# Patient Record
Sex: Female | Born: 1960 | Race: White | Hispanic: No | Marital: Married | Smoking: Never smoker
Health system: Southern US, Community
[De-identification: ages and names within clinical notes are randomized; demographics above are authoritative.]

---

## 1997-09-13 ENCOUNTER — Other Ambulatory Visit: Admission: RE | Admit: 1997-09-13 | Discharge: 1997-09-13 | Payer: Self-pay | Admitting: Obstetrics and Gynecology

## 1998-11-05 ENCOUNTER — Encounter: Payer: Self-pay | Admitting: *Deleted

## 1998-11-05 ENCOUNTER — Ambulatory Visit (HOSPITAL_COMMUNITY): Admission: RE | Admit: 1998-11-05 | Discharge: 1998-11-05 | Payer: Self-pay | Admitting: *Deleted

## 1998-11-17 ENCOUNTER — Other Ambulatory Visit: Admission: RE | Admit: 1998-11-17 | Discharge: 1998-11-17 | Payer: Self-pay | Admitting: Obstetrics and Gynecology

## 1999-03-31 ENCOUNTER — Other Ambulatory Visit: Admission: RE | Admit: 1999-03-31 | Discharge: 1999-03-31 | Payer: Self-pay | Admitting: *Deleted

## 1999-11-09 ENCOUNTER — Ambulatory Visit (HOSPITAL_COMMUNITY): Admission: RE | Admit: 1999-11-09 | Discharge: 1999-11-09 | Payer: Self-pay | Admitting: *Deleted

## 2001-03-07 ENCOUNTER — Ambulatory Visit (HOSPITAL_COMMUNITY): Admission: RE | Admit: 2001-03-07 | Discharge: 2001-03-07 | Payer: Self-pay | Admitting: *Deleted

## 2001-05-29 ENCOUNTER — Other Ambulatory Visit: Admission: RE | Admit: 2001-05-29 | Discharge: 2001-05-29 | Payer: Self-pay | Admitting: Obstetrics and Gynecology

## 2002-03-09 ENCOUNTER — Ambulatory Visit (HOSPITAL_COMMUNITY): Admission: RE | Admit: 2002-03-09 | Discharge: 2002-03-09 | Payer: Self-pay | Admitting: *Deleted

## 2003-03-25 ENCOUNTER — Ambulatory Visit (HOSPITAL_COMMUNITY): Admission: RE | Admit: 2003-03-25 | Discharge: 2003-03-25 | Payer: Self-pay | Admitting: *Deleted

## 2004-04-06 ENCOUNTER — Ambulatory Visit (HOSPITAL_COMMUNITY): Admission: RE | Admit: 2004-04-06 | Discharge: 2004-04-06 | Payer: Self-pay | Admitting: Obstetrics and Gynecology

## 2004-06-03 ENCOUNTER — Ambulatory Visit (HOSPITAL_COMMUNITY): Admission: RE | Admit: 2004-06-03 | Discharge: 2004-06-03 | Payer: Self-pay | Admitting: Obstetrics and Gynecology

## 2005-04-07 ENCOUNTER — Ambulatory Visit (HOSPITAL_COMMUNITY): Admission: RE | Admit: 2005-04-07 | Discharge: 2005-04-07 | Payer: Self-pay | Admitting: Obstetrics and Gynecology

## 2006-05-23 ENCOUNTER — Ambulatory Visit (HOSPITAL_COMMUNITY): Admission: RE | Admit: 2006-05-23 | Discharge: 2006-05-23 | Payer: Self-pay | Admitting: Obstetrics and Gynecology

## 2006-06-17 ENCOUNTER — Inpatient Hospital Stay (HOSPITAL_COMMUNITY): Admission: EM | Admit: 2006-06-17 | Discharge: 2006-06-26 | Payer: Self-pay | Admitting: Emergency Medicine

## 2006-12-23 ENCOUNTER — Ambulatory Visit: Payer: Self-pay | Admitting: Oncology

## 2007-01-26 LAB — CBC WITH DIFFERENTIAL/PLATELET
Basophils Absolute: 0 10*3/uL (ref 0.0–0.1)
EOS%: 0.7 % (ref 0.0–7.0)
Eosinophils Absolute: 0 10*3/uL (ref 0.0–0.5)
LYMPH%: 29.6 % (ref 14.0–48.0)
MCH: 32 pg (ref 26.0–34.0)
MCV: 89.8 fL (ref 81.0–101.0)
MONO#: 0.5 10*3/uL (ref 0.1–0.9)
MONO%: 7.3 % (ref 0.0–13.0)
Platelets: 286 10*3/uL (ref 145–400)
RDW: 13.5 % (ref 11.3–14.5)

## 2007-01-30 LAB — LACTATE DEHYDROGENASE: LDH: 156 U/L (ref 94–250)

## 2007-01-30 LAB — COMPREHENSIVE METABOLIC PANEL
AST: 18 U/L (ref 0–37)
Chloride: 105 mEq/L (ref 96–112)
Potassium: 4.5 mEq/L (ref 3.5–5.3)
Sodium: 140 mEq/L (ref 135–145)
Total Bilirubin: 0.2 mg/dL — ABNORMAL LOW (ref 0.3–1.2)
Total Protein: 7.4 g/dL (ref 6.0–8.3)

## 2007-01-30 LAB — PROTEIN C, TOTAL: Protein C, Total: 104 % (ref 70–140)

## 2007-01-30 LAB — LUPUS ANTICOAGULANT PANEL

## 2007-01-30 LAB — PROTEIN S, TOTAL: Protein S Ag, Total: 131 % (ref 70–140)

## 2007-02-15 ENCOUNTER — Ambulatory Visit: Payer: Self-pay | Admitting: Oncology

## 2007-02-20 ENCOUNTER — Ambulatory Visit (HOSPITAL_COMMUNITY): Admission: RE | Admit: 2007-02-20 | Discharge: 2007-02-20 | Payer: Self-pay | Admitting: Oncology

## 2007-06-20 ENCOUNTER — Ambulatory Visit (HOSPITAL_COMMUNITY): Admission: RE | Admit: 2007-06-20 | Discharge: 2007-06-20 | Payer: Self-pay | Admitting: Obstetrics and Gynecology

## 2008-07-25 ENCOUNTER — Ambulatory Visit (HOSPITAL_COMMUNITY): Admission: RE | Admit: 2008-07-25 | Discharge: 2008-07-25 | Payer: Self-pay | Admitting: Obstetrics and Gynecology

## 2009-02-09 IMAGING — CT CT ABD-PELV W/O CM
3 of 8 series · 12 of 42 positions shown, 18 images · IV contrast (CONTRAST)
Comparison: NONE

CLINICAL DATA: Left lower quadrant pain.  Diarrhea. 

CT ABDOMEN AND PELVIS WITHOUT AND WITH INTRAVENOUS AND FOLLOWING 
ORAL  CONTRAST
TECHNIQUE: Multiple axial 5-millimeter thick slices at 
5-millimeter intervals were obtained from the lung base through 
the pelvis following the intravenous administration of 97 cc of 
Optiray 350 at a rate of 3 cc per second.  Oral contrast was 
administered as well.  Arterial and venous phase imaging was 
obtained in the upper abdomen with delayed images obtained through 
the pelvis.

[Series 4: venous · axial · portal-venous · 0.86mm/px · z∈[+595,+930]mm · 5 of 101 slices shown, 10 images]
[im 17/101  soft-tissue]
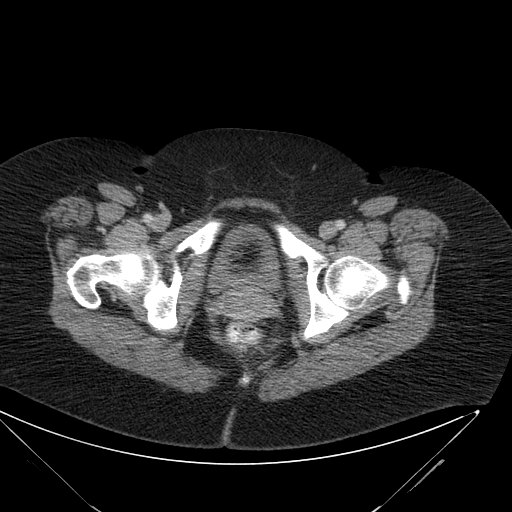
[im 17/101  bone]
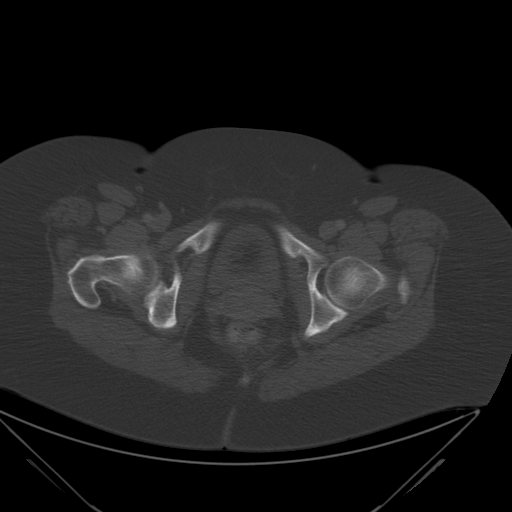
[im 34/101  soft-tissue]
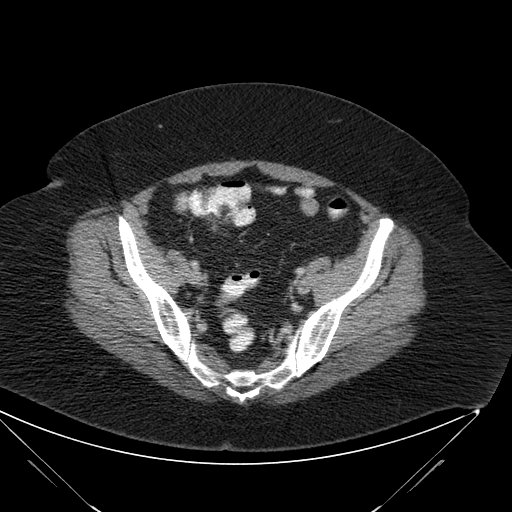
[im 34/101  lung]
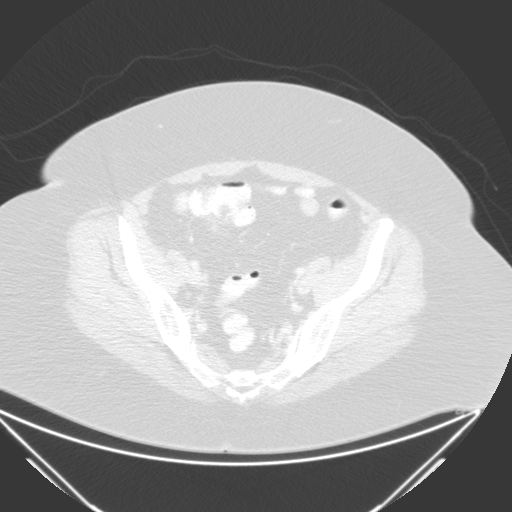
[im 51/101  soft-tissue]
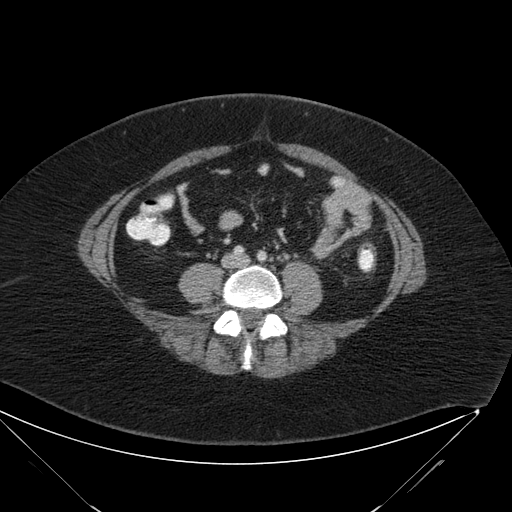
[im 51/101  lung]
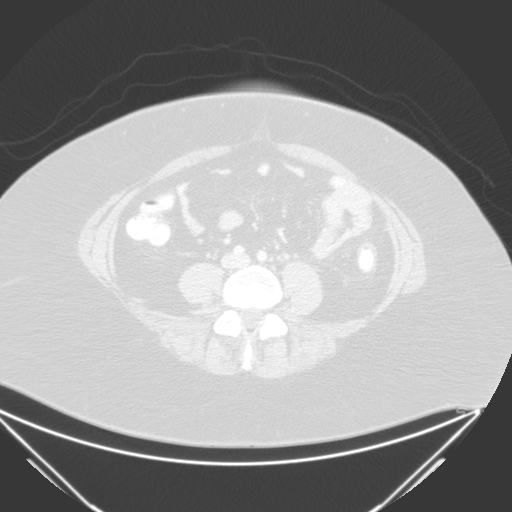
[im 67/101  soft-tissue]
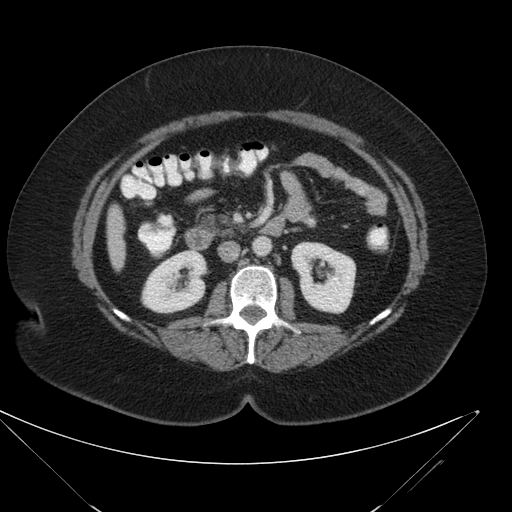
[im 67/101  lung]
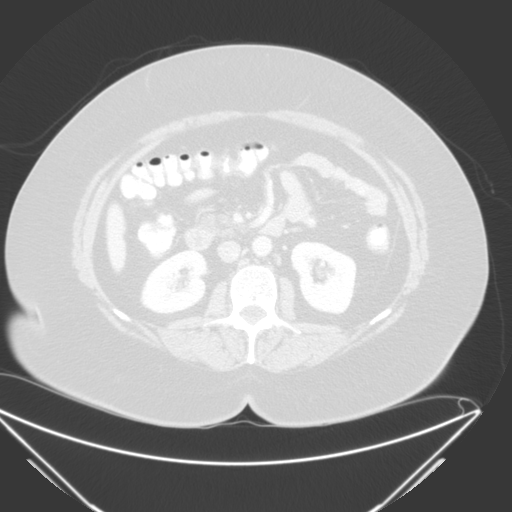
[im 84/101  soft-tissue]
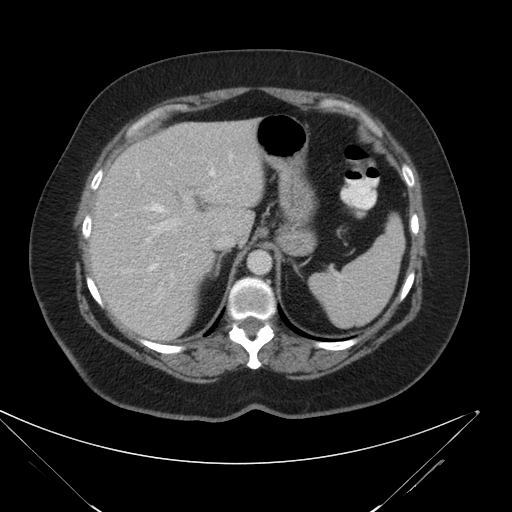
[im 84/101  lung]
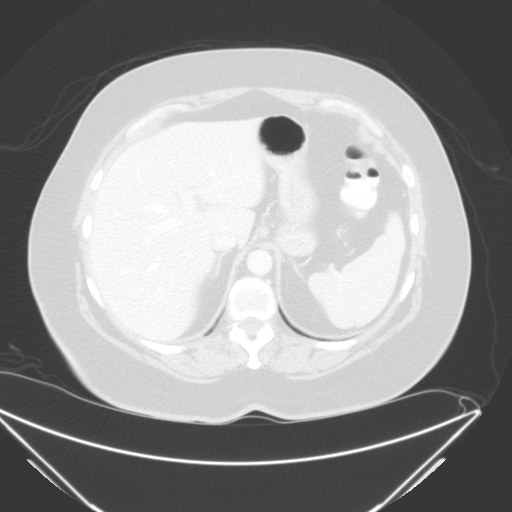

[Series 9: delays · axial · 0.87mm/px · z∈[+604,+889]mm · 4 of 95 slices shown]
[im 19/95  soft-tissue]
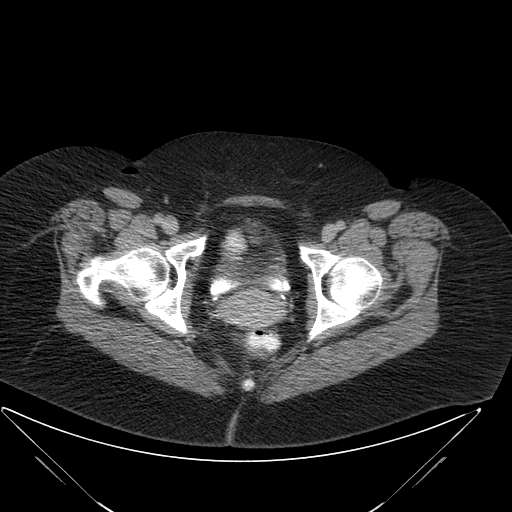
[im 38/95  soft-tissue]
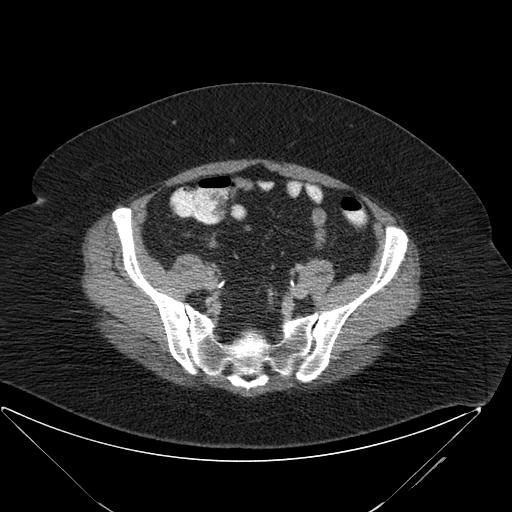
[im 57/95  soft-tissue]
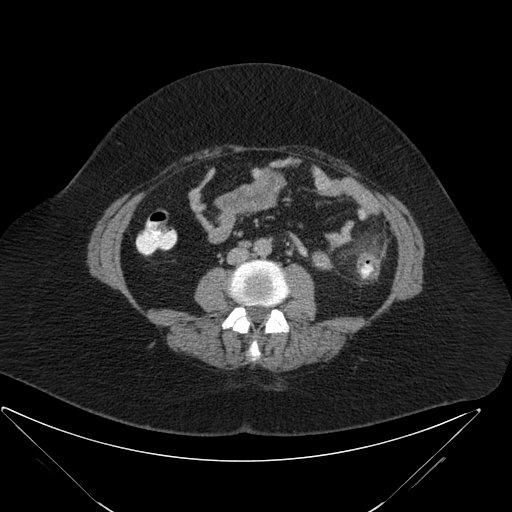
[im 76/95  soft-tissue]
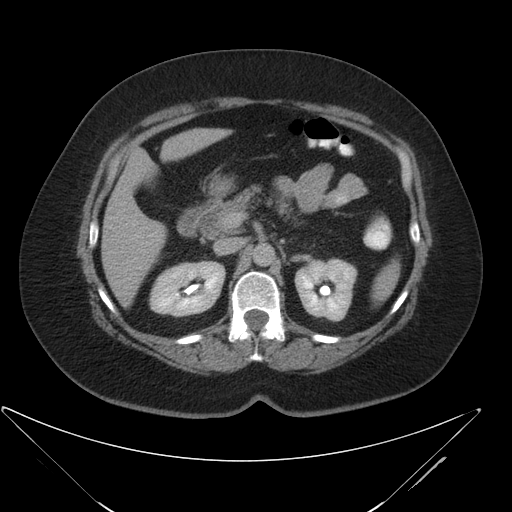

[coronals · coronal · 0.97mm/px · 3 of 70 slices shown, 4 images]
[im 24/70  soft-tissue]
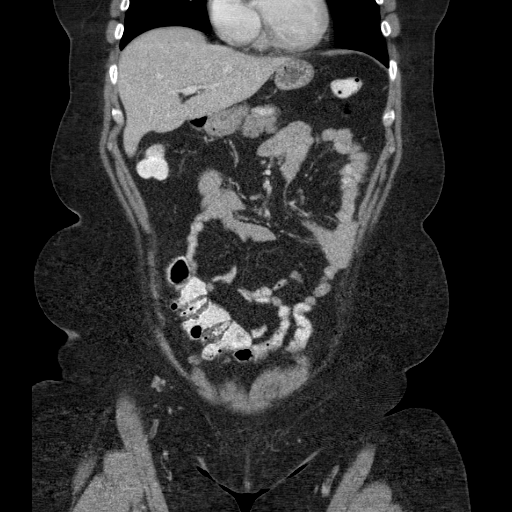
[im 31/70  soft-tissue]
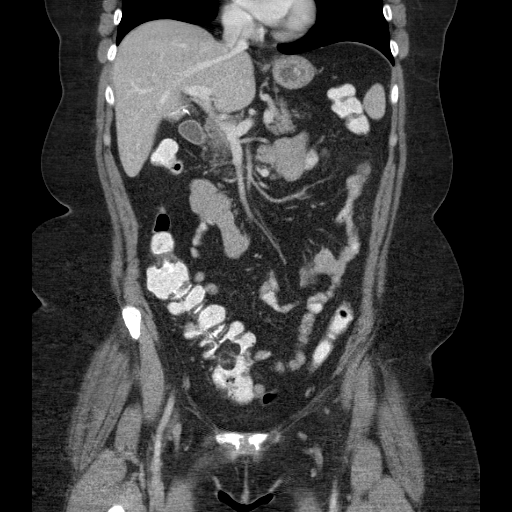
[im 31/70  bone]
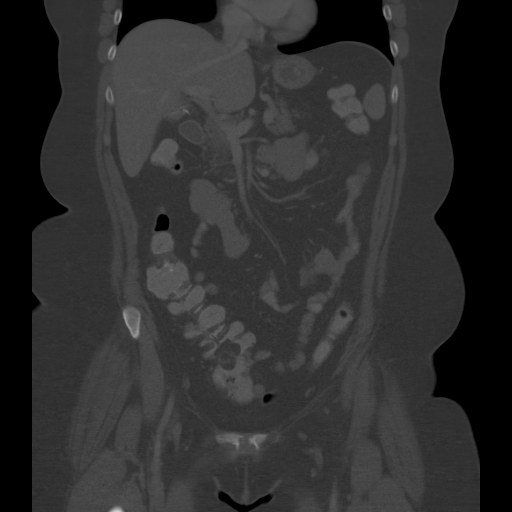
[im 39/70  soft-tissue]
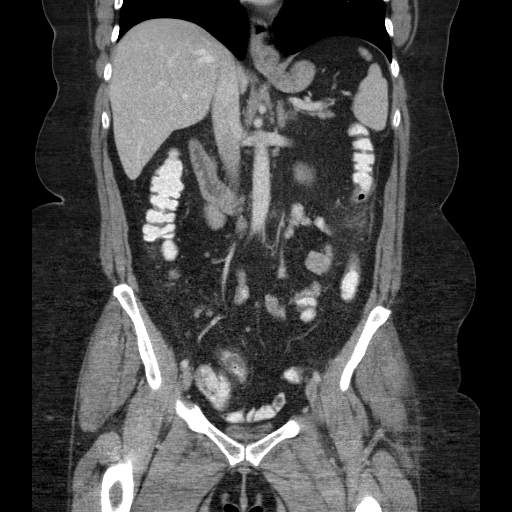

[12 of 42 positions shown; findings below may reference images not displayed]

FINDINGS: The heart size is within normal limits.  Visualized 
portions of the lung bases are within normal limits.  Patient is 
status post-cholecystectomy.  No evidence of hydronephrosis or 
nephrolithiasis.  Liver, spleen, adrenal glands, and pancreas are 
within normal limits.  Kidneys are within normal limits.  Uterus 
is grossly within normal limits.  There is a hypodense area seen 
in the right ovary.  This has a density of approximately 11 
Hounsfield units.  This measures approximately 4.8x1.7 cm.  Left 
ovary appears to be within normal limits.  The appendix is not 
visualized.  There are a few diverticula seen in the descending 
colon and evidence of stranding and increased density in the fat 
adjacent to the mid-descending colon.  No abscess collection. No 
free fluid or abdominal aortic aneurysm.  No evidence of 
lymphadenopathy.  No anterior abdominal wall hernia or inguinal 
hernia.  Visualized skeleton is within normal limits.
IMPRESSION: Current findings consistent with diverticulitis in 
the mid-descending colon. Hypodense lesion seen in the right 
ovary.  This likely represents an ovarian cyst.  I would recommend 
a pelvic ultrasound to exclude cystic ovarian neoplasm. No 
evidence of abscess or free air seen on the current study. Tiger 
12/23/2006  Trans Date: 12/23/2006 JH  JLM

## 2009-08-05 ENCOUNTER — Ambulatory Visit (HOSPITAL_COMMUNITY): Admission: RE | Admit: 2009-08-05 | Discharge: 2009-08-05 | Payer: Self-pay | Admitting: Obstetrics and Gynecology

## 2010-06-29 ENCOUNTER — Other Ambulatory Visit (HOSPITAL_COMMUNITY): Payer: Self-pay | Admitting: Obstetrics and Gynecology

## 2010-06-29 DIAGNOSIS — Z1231 Encounter for screening mammogram for malignant neoplasm of breast: Secondary | ICD-10-CM

## 2010-08-27 ENCOUNTER — Ambulatory Visit (HOSPITAL_COMMUNITY)
Admission: RE | Admit: 2010-08-27 | Discharge: 2010-08-27 | Disposition: A | Discharge status: Home / Self Care | Payer: Managed Care, Other (non HMO) | Source: Ambulatory Visit | Attending: Obstetrics and Gynecology | Admitting: Obstetrics and Gynecology

## 2010-08-27 DIAGNOSIS — Z1231 Encounter for screening mammogram for malignant neoplasm of breast: Secondary | ICD-10-CM

## 2010-09-25 NOTE — H&P (Signed)
[name], [name]              ACCOUNT NO.:  [id number]   MEDICAL RECORD NO.:  [id number]          PATIENT TYPE:  INP   LOCATION:  2923                         FACILITY:  [facility]   PHYSICIAN:  Isidor Holts, M.D.  DATE OF BIRTH:  July 10, 1960   DATE OF ADMISSION:  06/17/2006  DATE OF DISCHARGE:                              HISTORY & PHYSICAL   PRIMARY MEDICAL DOCTOR:  [facility].   CHIEF COMPLAINT:  Right-sided chest pain, hemoptysis and shortness of  breath of 1-day duration.   HISTORY OF PRESENT ILLNESS:  This is a 50 year old female.  For past  medical history, see below.  According to the patient, she has been  quite well until she went to work on June 16, 2006.  Sometime in the  afternoon, she developed right-sided/lower back pain which progressed  gradually, and subsequently became so bad that she could not really  sleep in bed all night, kept on changing positions and ended up sleeping  in the recliner.  She thought she had pulled something, although she  denies any trauma or heavy lifting.  This a.m., however, she got up and  got ready to go to work and thought throat was somewhat congested.  She coughed, and had hemoptysis about three times.  That alarmed her,  and she went to her primary MD who did a chest x-ray which apparently  was a technically poor film and nondiagnostic.  She was therefore  referred to [facility], where she had a chest CT angiogram  which confirmed a left pulmonary embolism.  She was referred to the  emergency department accordingly.   PAST MEDICAL HISTORY:  1. Seizure disorder.  2. Hypothyroidism.  3. Depression.  4. Irritable bowel syndrome.  5. Herpes genitalis, on chronic suppressive treatment.  6. Status post cholecystectomy.   MEDICATIONS:  1. Mysoline 250 mg p.o. t.i.d.  2. Zovirax 400 mg p.o. daily.  3. Synthroid 50 mcg p.o. daily.  4. Questran 1 g p.o. daily.  5. Zoloft 100 mg p.o. daily.  6. Calcium  with vitamin D 1200 mg p.o. daily.   ALLERGIES:  1. ERYTHROMYCIN  2. PHENYTOIN.  3. SULFA.  4. TEGRETOL.   SYSTEMS REVIEW:  As per HPI and chief complaint, otherwise negative.  The patient denies fever.  Denies abdominal pain, vomiting or diarrhea.  Denies leg pain or swelling.  She has had no recent travel, although she  does have a sedentary occupation.   SOCIAL HISTORY:  The patient is married, has 2 offspring who are alive  and well.  She is an ex-smoker, smoked a pack and half a day for 15  years but quit in 1991.  Denies alcohol use or drug abuse.   FAMILY HISTORY:  Both parents still living.  The patient's mother has  dyslipidemia and hypertension.  Her father is hypertensive.   FAMILY HISTORY:  Is otherwise noncontributory.  Certainly, there is no  history of venous thromboembolic disease in her family.   PHYSICAL EXAMINATION:  VITALS:  Temperature maximum 101.5, pulse 88 per  minute - regular, respiratory rate 20, BP 119/77 mmHg,  pulse oximeter  97% on 2 liters of oxygen.  The patient does not appear to be in obvious acute distress, although  she is unable to take deep breaths secondary to right-sided pleuritic-  type pain.  Alert, oriented, communicative, not short of breath at rest.  HEENT:  No clinical pallor.  No jaundice.  No conjunctival injection.  Has herpes labialis at the right angle of her upper lip.  Throat is  clear.  NECK:  Supple.  JVP not seen.  No palpable lymphadenopathy.  No palpable  goiter.  CHEST:  Clinically clear to auscultation.  No wheezes or crackles  although there is diminished air entry, right base.  HEART:  Sounds 1 and 2 heard, normal, regular; no murmurs.  ABDOMEN:  Is moderately obese, soft and nontender.  No palpable  organomegaly.  No palpable masses.  Normal bowel sounds.  LOWER EXTREMITY EXAMINATION:  No pitting edema.  Palpable peripheral  pulses.  No calf tenseness or tenderness noted.  MUSCULOSKELETAL SYSTEM:  Unremarkable.   CENTRAL NERVOUS SYSTEM:  No focal neurologic deficits on gross  examination.   INVESTIGATIONS:  CBC:  WBC 14.1, hemoglobin 13.1, hematocrit 38.0,  platelets 337.  Electrolytes:  Sodium 132, potassium 3.7, chloride 100,  CO2 22, BUN 6, creatinine 0.51, glucose 129.  AST 25, ALT 19.  INR 1.2.  Chest CT angiogram done at [facility] was reviewed by ED MD and  discussed with radiologist, and reportedly shows a right basal pneumonia  as well as left pulmonary embolism.   ASSESSMENT AND PLAN:  1. Right basilar pneumonia, community-acquired.  The patient was      already given Rocephin in the emergency department at the time of      this evaluation.  We will add Azithromycin.  Continue oxygen      supplementation and p.r.n. bronchodilator nebulizers.   1. Pulmonary embolism, left lung.  We shall commence anticoagulation      with intravenous heparin and start concomitant Coumadin.  However,      we shall send blood samples for thrombophilic screen, including      protein C, protein S, antithrombin III, prothrombin gene mutation,      homocysteine, Factor V Leiden, lupus anticoagulant, prior to      commencing anticoagulation as there is no obvious predisposing      factor.  For completeness, will do bilateral lower extremity venous      Doppler to rule out DVT.   1. Seizure disorder.  The patient has been seizure free for 22 years.      We shall continue pre-admission anticonvulsants.   1. History of hypothyroidism.  Continue Synthroid.  Check TSH and      lipids.   1. Irritable bowel syndrome.  Continue pre-admission Questran.   Further management will depend on clinical course.      Isidor Holts, M.D.  Electronically Signed     CO/MEDQ  D:  06/17/2006  T:  06/18/2006  Job:  [id number]

## 2010-09-25 NOTE — Discharge Summary (Signed)
[name], [name]              ACCOUNT NO.:  [id number]   MEDICAL RECORD NO.:  [id number]          PATIENT TYPE:  INP   LOCATION:  3743                         FACILITY:  [facility]   PHYSICIAN:  Madaline Savage, MD        DATE OF BIRTH:  12/25/1960   DATE OF ADMISSION:  06/17/2006  DATE OF DISCHARGE:  06/26/2006                               DISCHARGE SUMMARY   PRIMARY CARE PHYSICIAN:  [facility].   DISCHARGE DIAGNOSES:  1. Right lower lobe pneumonia with pleuritic chest pain.  2. Left-sided pulmonary embolus.  3. Hypothyroidism.  4. Irritable bowel syndrome.  5. Obesity.  6. History of seizure disorder in the past.   DISCHARGE MEDICATIONS:  1. Coumadin 50 mg daily, adjust according to INR.  2. Mysoline 250 mg 3 times daily.  3. Synthroid 50 mcg daily.  4. Questran 1 gram daily.  5. Zoloft 100 mg daily.  6. Calcium and Vitamin D.   HISTORY OF PRESENT ILLNESS:  For a full history of present illness see  history and physical dictated by Dr. Brien Few on June 17, 2006.  In short,  this is a 50 year old lady who comes in with right-sided chest pain,  hemoptysis, and shortness of breath.  Her initial investigations showed  right basilar pneumonia and also a CT scan showed a left-sided pulmonary  embolism.  She was admitted for further evaluation and management.  She  had the CT scan of her chest done at [facility], where the  pulmonary embolism was confirmed there.  It was not done in the  hospital.   PROBLEM LIST:  1. Left-sided pulmonary embolism.  She was started on Coumadin and the      Lovenox in the hospital.  To start to get her INR up, we had to      escalate the doses.  At the time of discharge, she was on 17.5 mg      of Coumadin daily.  I will be starting her on 15 mg of Coumadin a      day, hoping that that will keep her INR stable.  She will have her      INR checked in 2 days time at her primary care doctor's office and      her primary care  doctor will adjust the Coumadin according to her      INR.  2. Right-sided pneumonia with the pleuritic chest pain.  She had chest      pain on the right side where she had the pneumonia and her pain has      resolved after a couple of days.  She is now chest pain free and      she has been symptom free for the last couple of days.  She was      treated with antibiotics for 7 days in the hospital and she will      not be put on any antibiotics as an outpatient.  3. Hypothyroidism.  Continue on the same dose of Synthroid.   DISPOSITION:  She is now being discharged home in  a stable condition.   FOLLOWUP:  She will followup with her primary care doctor at [facility] in 2 days, she has an appointment.  She will also get  her PT and INR checked on June 28, 2006, at [facility], and the Coumadin dose will be adjusted accordingly.  She will  need to be on Coumadin for 3 to 6 months.  This is her first episode of  venous clot.      Madaline Savage, MD  Electronically Signed     PKN/MEDQ  D:  06/26/2006  T:  06/27/2006  Job:  [id number]

## 2010-09-25 NOTE — H&P (Signed)
[name], [name]              ACCOUNT NO.:  [id number]   MEDICAL RECORD NO.:  [id number]          PATIENT TYPE:  AMB   LOCATION:  SDC                           FACILITY:  [facility]   PHYSICIAN:  Richardean Sale, M.D.   DATE OF BIRTH:  Aug 24, 1960   DATE OF ADMISSION:  DATE OF DISCHARGE:                                HISTORY & PHYSICAL   PREOPERATIVE DIAGNOSIS:  Menorrhagia.   HISTORY OF PRESENT ILLNESS:  This is a 50 year old, gravida 3, para 2-0-1-2,  white female who has a history of significantly worsening menses that have  been refractory to treatment with progestin medication.  The patient is  unable to take oral contraceptive pills.  During her evaluation for  menorrhagia, the patient had an endometrial biopsy performed, which was  negative and a sonohysterogram which was negative for any endometrial polyp  or fibroids.  The patient's TSH was abnormal, consistent with hypothyroidism  and over the past few  months she has been treated with thyroid medication,  however, her menorrhagia has persisted.  We have discussed multiple  different options for treatment of menorrhagia, which include insertion of a  levonorgestrel IUD, continued attempt at effective control with progestins,  versus hysteroscopy with endometrial ablation.  The patient presents today  for hysteroscopy NovaSure endometrial ablation.   PAST MEDICAL HISTORY:  1.  Epilepsy.  2.  Hypothyroidism.  3.  Depression.  4.  Irritable bowel syndrome.  5.  HSV.   SURGICAL HISTORY:  Cholecystectomy.   SOCIAL HISTORY:  Former smoker.  No alcohol or drugs.   FAMILY HISTORY:  No known breast, uterine, ovarian, or colon cancer.  No  known bleeding disorder.   GYNECOLOGIC HISTORY:  No history of abnormal Pap smears.  Menses are  currently occurring every month, lasting up to seven days with flooding of  her clothes and interrupting her normal daily activities.   OBSTETRIC HISTORY:  Vaginal delivery x2.  First trimester  loss x1.   MEDICATIONS:  Zoloft, Zovirax, Naselin, Synthroid, cholestyramine,  multivitamin, calcium supplement.   DRUG ALLERGIES:  1.  ERYTHROMYCIN.  2.  SULFA.  3.  CODEINE.  4.  DILANTIN.  5.  TEGRETOL.   PHYSICAL EXAMINATION:  VITAL SIGNS:  Blood pressure is 118/70, weight 229,  height 5 foot 7 inches.  GENERAL:  She is a well-developed, well-nourished, white female in no  apparent distress.  NECK:  And thyroid are within normal limits without  masses.  HEART:  Regular rate and rhythm.  LUNGS:  Clear to auscultation bilaterally.  ABDOMEN:  Obese, soft, and nontender.  No palpable masses.  Liver spleen are  normal.  EXTERNAL GENITALIA:  Within normal limits.  PELVIC:  Uterus is small, midline, mobile, retroverted, nontender.  Adnexa  not palpable.  EXTREMITIES:  No cyanosis, clubbing, or edema.   ASSESSMENT:  A 50 year old, gravida 3, para 2-0-1-2, white female presents  for hysteroscopy with NovaSure endometrial ablation.   PLAN:  I have reviewed the risks, benefits, and alternatives of the  treatment with the patient.  Informed consent was obtained.  We discussed  the risk of  hemorrhage requiring transfusion, injury to the uterus (uterine  perforation) which would require additional surgery through the abdomen to  evaluate for any injury to the bowel or bladder or other abdominal organs,  infection, anesthetic-related complications, DVT, and even death.  I  discussed with the patient that the goal of therapy is to decrease her  menstrual flow and to not eliminate her cycle entirely.  The patient voices  an understanding of the above and agrees to proceed.  All questions have  been answered and informed consent obtained.      JW/MEDQ  D:  06/02/2004  T:  06/02/2004  Job:  [id number]

## 2010-09-25 NOTE — Op Note (Signed)
[name], Mooney              ACCOUNT NO.:  [id number]   MEDICAL RECORD NO.:  [id number]          PATIENT TYPE:  AMB   LOCATION:  SDC                           FACILITY:  [facility]   PHYSICIAN:  Richardean Sale, M.D.   DATE OF BIRTH:  20-Jan-1961   DATE OF PROCEDURE:  06/03/2004  DATE OF DISCHARGE:                                 OPERATIVE REPORT   PREOPERATIVE DIAGNOSES:  Menorrhagia.   POSTOPERATIVE DIAGNOSES:  Menorrhagia.   PROCEDURE:  Hysteroscopy with NovaSure endometrial ablation.   SURGEON:  Richardean Sale, M.D.   ANESTHESIA:  Conscious sedation with paracervical block.   COMPLICATIONS:  None.   ESTIMATED BLOOD LOSS:  Minimal.   FLUID DEFICIT FOR HYSTEROSCOPY:  40 mL.   FINDINGS:  Normal appearing uterine cavity, measurements for NovaSure  uterine sound length 9 cm, cervical length 4.5 cm, cavity length 4.5 cm.  Cavity width 4.3 cm, power 106 x 1 minute 50 seconds.   INDICATIONS FOR PROCEDURE:  This is a 50 year old, gravida 3, para 2-0-1-2  white female with menorrhagia who has had a negative endometrial biopsy and  a negative sonohysterogram with no structural cause identified for her  menorrhagia. The patient has been tried on hormonal medications to control  her bleeding with no success. Her bleeding has been interfering with her  daily activity and she presents today for NovaSure endometrial ablation.  Prior to the procedure, the risk of the procedure had been reviewed with the  patient and informed consent obtained. We discussed the risk of hemorrhage  requiring transfusion, infection which could require hospitalization and  antibiotics and could cause scarring, injury to the uterus, uterine  perforation (which would require additional surgery through the abdomen to  evaluate for any injury to the bowel or the bladder or other abdominal  organ, anesthetic complications, DVT and even death). The patient voiced an  understanding of all these risks and agreed to  proceed.  Informed consent  obtained before proceeding to the OR.   DESCRIPTION OF PROCEDURE:  The patient was taken to the operating room where  she was given intravenous sedation. She was then prepped and draped in the  usual sterile fashion with Betadine and a red rubber catheter was used to  drain the bladder.  Bimanual exam was performed, the uterus was small,  slightly retroverted, mobile with no masses, adnexa were not palpable. A  speculum was then placed into the vagina, the cervix was easily visualized  and 2 mL of 1% Nesacaine were injected at the 12 o'clock position. A single  tooth tenaculum was then used to grasp the anterior lip of the cervix and a  paracervical block was then administered using a total of 22 mL of 1%  Nesacaine.  From this point, the uterus sounded to 9 cm, the cervical length  was estimated using a Pratt dilator at 4.5 cm making the uterine cavity  length 4.5 cm.  The settings were inserted into the NovaSure machine. At  this point, hysteroscopy was performed which revealed a normal appearing  intrauterine cavity with no obvious structural defects, no polyps, no  fibroids identified.  At this point, the hysteroscope was removed,  additional dilation was then performed to insert the NovaSure device. The  patient was complaining of discomfort, she was then given some additional  intravenous sedation by the nurse anesthetist and her discomfort was  adequately relieved.  Once the NovaSure device was inserted and the  measurement set appropriately and the device was fit and seated (the CO2  test was then performed and the cavity assessment was intact).  The NovaSure  device was then turned on and the ablation was performed without difficulty.  Total time was 1 minute 50 seconds, power was 106.  Once the ablation was  completed, the NovaSure device was removed, the hysteroscope was then  reintroduced and post procedural pictures were taken and the cavity had  been  adequately ablated.  At this point, the hysteroscope was removed, the single  tooth tenaculum was removed, there was minimal bleeding coming from the  cervical tenaculum site.  The patient tolerated the procedure very well with  no complications.  All instruments were removed from the patient's vagina.  The patient was taken out of the dorsal lithotomy position and was  transferred to the recovery room awake and in satisfactory condition.  All  sponge, lap, needle and instrument counts were correct x2. There were no  complications.      JW/MEDQ  D:  06/03/2004  T:  06/03/2004  Job:  [id number]

## 2011-04-22 ENCOUNTER — Other Ambulatory Visit: Payer: Self-pay | Admitting: Orthopedic Surgery

## 2011-04-22 DIAGNOSIS — M545 Low back pain: Secondary | ICD-10-CM

## 2011-04-25 ENCOUNTER — Ambulatory Visit
Admission: RE | Admit: 2011-04-25 | Discharge: 2011-04-25 | Disposition: A | Discharge status: Home / Self Care | Payer: Managed Care, Other (non HMO) | Source: Ambulatory Visit | Attending: Orthopedic Surgery | Admitting: Orthopedic Surgery

## 2011-04-25 DIAGNOSIS — M545 Low back pain: Secondary | ICD-10-CM

## 2011-07-20 ENCOUNTER — Other Ambulatory Visit (HOSPITAL_COMMUNITY): Payer: Self-pay | Admitting: Family Medicine

## 2011-07-20 DIAGNOSIS — Z1231 Encounter for screening mammogram for malignant neoplasm of breast: Secondary | ICD-10-CM

## 2011-08-19 ENCOUNTER — Telehealth: Payer: Self-pay | Admitting: *Deleted

## 2011-08-19 NOTE — Telephone Encounter (Signed)
Rec'd referral to for patient in regards to changing Coumadin to Pradaxa. Patient was previously seen by Dr Arline Asp and he has called and spoke to Dr Benedetto Goad directly about this referral. No appt to be made at this time.

## 2011-09-02 ENCOUNTER — Ambulatory Visit (HOSPITAL_COMMUNITY)
Admission: RE | Admit: 2011-09-02 | Discharge: 2011-09-02 | Disposition: A | Discharge status: Home / Self Care | Payer: Managed Care, Other (non HMO) | Source: Ambulatory Visit | Attending: Family Medicine | Admitting: Family Medicine

## 2011-09-02 DIAGNOSIS — Z1231 Encounter for screening mammogram for malignant neoplasm of breast: Secondary | ICD-10-CM | POA: Insufficient documentation

## 2012-08-02 ENCOUNTER — Other Ambulatory Visit (HOSPITAL_COMMUNITY): Payer: Self-pay | Admitting: Obstetrics and Gynecology

## 2012-08-02 DIAGNOSIS — Z1231 Encounter for screening mammogram for malignant neoplasm of breast: Secondary | ICD-10-CM

## 2012-09-05 ENCOUNTER — Ambulatory Visit (HOSPITAL_COMMUNITY)
Admission: RE | Admit: 2012-09-05 | Discharge: 2012-09-05 | Disposition: A | Discharge status: Home / Self Care | Payer: Managed Care, Other (non HMO) | Source: Ambulatory Visit | Attending: Obstetrics and Gynecology | Admitting: Obstetrics and Gynecology

## 2012-09-05 DIAGNOSIS — Z1231 Encounter for screening mammogram for malignant neoplasm of breast: Secondary | ICD-10-CM

## 2013-07-30 ENCOUNTER — Other Ambulatory Visit (HOSPITAL_COMMUNITY): Payer: Self-pay | Admitting: Obstetrics and Gynecology

## 2013-07-30 DIAGNOSIS — Z1231 Encounter for screening mammogram for malignant neoplasm of breast: Secondary | ICD-10-CM

## 2013-09-13 ENCOUNTER — Ambulatory Visit (HOSPITAL_COMMUNITY)
Admission: RE | Admit: 2013-09-13 | Discharge: 2013-09-13 | Disposition: A | Discharge status: Home / Self Care | Payer: Managed Care, Other (non HMO) | Source: Ambulatory Visit | Attending: Obstetrics and Gynecology | Admitting: Obstetrics and Gynecology

## 2013-09-13 DIAGNOSIS — Z1231 Encounter for screening mammogram for malignant neoplasm of breast: Secondary | ICD-10-CM

## 2014-08-12 ENCOUNTER — Other Ambulatory Visit (HOSPITAL_COMMUNITY): Payer: Self-pay | Admitting: Obstetrics and Gynecology

## 2014-08-12 DIAGNOSIS — Z1231 Encounter for screening mammogram for malignant neoplasm of breast: Secondary | ICD-10-CM

## 2014-09-24 ENCOUNTER — Ambulatory Visit (HOSPITAL_COMMUNITY): Payer: Managed Care, Other (non HMO)

## 2014-09-26 ENCOUNTER — Ambulatory Visit (HOSPITAL_COMMUNITY)
Admission: RE | Admit: 2014-09-26 | Discharge: 2014-09-26 | Disposition: A | Discharge status: Home / Self Care | Payer: Managed Care, Other (non HMO) | Source: Ambulatory Visit | Attending: Obstetrics and Gynecology | Admitting: Obstetrics and Gynecology

## 2014-09-26 DIAGNOSIS — Z1231 Encounter for screening mammogram for malignant neoplasm of breast: Secondary | ICD-10-CM | POA: Diagnosis present

## 2015-06-26 ENCOUNTER — Ambulatory Visit (INDEPENDENT_AMBULATORY_CARE_PROVIDER_SITE_OTHER): Payer: BLUE CROSS/BLUE SHIELD | Admitting: Podiatry

## 2015-06-26 ENCOUNTER — Encounter: Payer: Self-pay | Admitting: Podiatry

## 2015-06-26 VITALS — BP 129/74 | HR 63 | Resp 14

## 2015-06-26 DIAGNOSIS — L03031 Cellulitis of right toe: Secondary | ICD-10-CM | POA: Diagnosis not present

## 2015-06-26 DIAGNOSIS — L600 Ingrowing nail: Secondary | ICD-10-CM | POA: Diagnosis not present

## 2015-06-26 NOTE — Progress Notes (Signed)
Subjective:     Patient ID: [name], female   DOB: [birth year], 55 y.o.   MRN: [id number]  HPI this patient presents to the office with chief complaint of a painful ingrowing toenail inside border right great toe.  Patient states the nail became red and swollen a few days ago and she treated the nail was Orajel. She also admits to having previous work done on both sides of her left big toenail. She presents the office today for definitive evaluation and treatment of this condition   Review of Systems     Objective:   Physical Exam GENERAL APPEARANCE: Alert, conversant. Appropriately groomed. No acute distress.  VASCULAR: Pedal pulses palpable at  [facility] and PT bilateral.  Capillary refill time is immediate to all digits,  Normal temperature gradient.  Digital hair growth is present bilateral  NEUROLOGIC: sensation is normal to 5.07 monofilament at 5/5 sites bilateral.  Light touch is intact bilateral, Muscle strength normal.  MUSCULOSKELETAL: acceptable muscle strength, tone and stability bilateral.  Intrinsic muscluature intact bilateral.  Rectus appearance of foot and digits noted bilateral.   DERMATOLOGIC: skin color, texture, and turgor are within normal limits.  No preulcerative lesions or ulcers  are seen, no interdigital maceration noted.  No open lesions present.  . No drainage noted.  NAILS  Marked incurvation medial and lateral borders right great toe.  Redness and swelling along the medial border right great toe.      Assessment:     Ingrown Toenail right hallux.  Paronychia right great toe.     Plan:     IE  Nail surgery. Treatment options and alternatives discussed.  Recommended permanent phenol matrixectomy and patient agreed. Right hallux  was prepped with alcohol and a toe block of 3cc of 2% lidocaine plain was administered in a digital toe block. .  The toe was then prepped with betadine solution .  The offending nail border was then excised and matrix tissue exposed.   Phenol was then applied to the matrix tissue followed by an alcohol wash.  Antibiotic ointment and a dry sterile dressing was applied.  The patient was dispensed instructions for aftercare. RTC 1 week.     Helane Gunther DPM

## 2015-07-03 ENCOUNTER — Ambulatory Visit (INDEPENDENT_AMBULATORY_CARE_PROVIDER_SITE_OTHER): Payer: BLUE CROSS/BLUE SHIELD | Admitting: Podiatry

## 2015-07-03 ENCOUNTER — Encounter: Payer: Self-pay | Admitting: Podiatry

## 2015-07-03 VITALS — BP 121/72 | HR 78 | Resp 14

## 2015-07-03 DIAGNOSIS — Z09 Encounter for follow-up examination after completed treatment for conditions other than malignant neoplasm: Secondary | ICD-10-CM

## 2015-07-03 NOTE — Progress Notes (Signed)
Patient ID: Makiah Foye, female   DOB: 1961-04-25, 55 y.o.   MRN: [id number] This patient returns to the office following nail surgery one week ago.  The patient says toe has been soaked and bandaged as directed.  There has been improvement of the toe since the surgery has been performed. The patient presents for continued evaluation and treatment.  GENERAL APPEARANCE: Alert, conversant. Appropriately groomed. No acute distress.  VASCULAR: Pedal pulses palpable at  [facility] and PT bilateral.  Capillary refill time is immediate to all digits,  Normal temperature gradient.    NEUROLOGIC: sensation is normal to 5.07 monofilament at 5/5 sites bilateral.  Light touch is intact bilateral, Muscle strength normal.  MUSCULOSKELETAL: acceptable muscle strength, tone and stability bilateral.  Intrinsic muscluature intact bilateral.  Rectus appearance of foot and digits noted bilateral.   DERMATOLOGIC: skin color, texture, and turgor are within normal limits.  No preulcerative lesions or ulcers  are seen, no interdigital maceration noted.   NAILS  There is necrotic tissue along the nail groove  In the absence of redness swelling and pain.  DX  S/p nail surgery  ROV  Home instructions were discussed.  Patient to call the office if there are any questions or concerns.   Helane Gunther DPM

## 2015-07-07 DIAGNOSIS — Z9229 Personal history of other drug therapy: Secondary | ICD-10-CM | POA: Insufficient documentation

## 2015-07-07 DIAGNOSIS — Z7901 Long term (current) use of anticoagulants: Secondary | ICD-10-CM | POA: Insufficient documentation

## 2015-07-07 DIAGNOSIS — F32A Depression, unspecified: Secondary | ICD-10-CM | POA: Insufficient documentation

## 2015-07-07 DIAGNOSIS — E039 Hypothyroidism, unspecified: Secondary | ICD-10-CM | POA: Insufficient documentation

## 2015-07-07 DIAGNOSIS — R197 Diarrhea, unspecified: Secondary | ICD-10-CM | POA: Insufficient documentation

## 2015-07-07 DIAGNOSIS — I10 Essential (primary) hypertension: Secondary | ICD-10-CM | POA: Insufficient documentation

## 2015-07-07 DIAGNOSIS — F329 Major depressive disorder, single episode, unspecified: Secondary | ICD-10-CM | POA: Insufficient documentation

## 2015-08-14 ENCOUNTER — Ambulatory Visit: Payer: Managed Care, Other (non HMO) | Admitting: Dietician

## 2015-09-01 ENCOUNTER — Encounter: Payer: BLUE CROSS/BLUE SHIELD | Attending: Family Medicine | Admitting: Dietician

## 2015-09-01 ENCOUNTER — Encounter: Payer: Self-pay | Admitting: Dietician

## 2015-09-01 DIAGNOSIS — Z6841 Body Mass Index (BMI) 40.0 and over, adult: Secondary | ICD-10-CM | POA: Insufficient documentation

## 2015-09-01 DIAGNOSIS — F3289 Other specified depressive episodes: Secondary | ICD-10-CM | POA: Insufficient documentation

## 2015-09-01 DIAGNOSIS — E038 Other specified hypothyroidism: Secondary | ICD-10-CM | POA: Diagnosis not present

## 2015-09-01 DIAGNOSIS — Z8669 Personal history of other diseases of the nervous system and sense organs: Secondary | ICD-10-CM | POA: Diagnosis not present

## 2015-09-01 DIAGNOSIS — I10 Essential (primary) hypertension: Secondary | ICD-10-CM | POA: Insufficient documentation

## 2015-09-01 DIAGNOSIS — R7309 Other abnormal glucose: Secondary | ICD-10-CM

## 2015-09-01 NOTE — Patient Instructions (Signed)
Eat within 1-2 hours of waking. Be a mindful eater.  Choose wisely, eat slowly, stop when you are full. Eat away at the table, away from TV and other electronics. Be aware of emotional eating.  Ask:  Am I really hungry or eating for another reason? Be as active as you can.  Consider arm chair exercises. Consider a probiotic or foods that are good sources of this.  (Kefir)

## 2015-09-01 NOTE — Progress Notes (Addendum)
  Medical Nutrition Therapy:  Appt start time: 1415 end time:  1530.   Assessment:  Primary concerns today: Patient is here alone.  She would like to get on track, stay on track to lose weight and improve her blood sugar.  She wants help with a meal plan.  Diverticulitis is flaring up today.  Used to have severe diarrhea when children where younger.  She started questran which has helLatviaped dramatically.  Referral for obesity and borderline glucose.  She has a hx of hypothyroidism, pulmonary emboli, and HTN.  She used to smoke and quit 25 years ago.  Weight hx: Lowest adult weight:  129 lbs Highest:  289 lbs today  Patient lives with her husband.  She does the shopping and cooking and eats out 1-2 times per week. 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  Preferred Learning Style:   No preference indicated   Learning Readiness:   Ready  MEDICATIONS: see list   DIETARY INTAKE: Usual eating pattern includes 2-3 meals and 2 snacks per day.  Everyday foods include lean meat, chicken without skin.  Avoided foods include fried foods.    24-hr recall:  B (10-11 AM): used to skip but now eats cheese omelet with toast   OR instant plain oatmeal with brown sugar or dry cereal with skim milk, juicie with Questran Snk ( AM): none  L ( PM): sometimes skips Snk ( PM): cupcake or chips or cheese stick or yogurt D ( PM): meat, starch, vegetable  Snk ( PM): ice cream, chips, popcorn Beverages: diet caffeine free Mt. Dew, sweet tea, water, juice, or occasional regular Pepsi  Usual physical activity: none, walks occasionally but increased hip pain  Estimated energy needs: 1800 calories 200 g carbohydrates 135 g protein 50 g fat  Progress Towards Goal(s):  In progress.   Nutritional Diagnosis:  NB-1.1 Food and nutrition-related knowledge deficit As related to balance of carbohydrate, protein, and fat.  As evidenced by diet hx.    Intervention:  Nutrition  Counseling/Education related to mindful eating to improve overall habits for improvement of weight and glucose.  Discussed impact of emotional eating.  Patient with increased stress and states that this plays a role in her GI problems.  Gut brain connection discussed.  She verbalized stress over sudden death of sister several years ago.  Some depression improved on Zoloft.  She is receptive to counseling.  Eat within 1-2 hours of waking. Be a mindful eater.  Choose wisely, eat slowly, stop when you are full. Eat away at the table, away from TV and other electronics. Be aware of emotional eating.  Ask:  Am I really hungry or eating for another reason? Be as active as you can.  Consider arm chair exercises. Consider a probiotic or foods that are good sources of this.  (Kefir)   Teaching Method Utilized:  Visual Auditory Hands on  Handouts given during visit include:  Yellow meal plan card  Label reading  Snack list  My plate  Counseling list  Barriers to learning/adherence to lifestyle change: none  Demonstrated degree of understanding via:  Teach Back   Monitoring/Evaluation:  Dietary intake, exercise, label reading, and body weight in 1 month(s).  Further discussion about meal planning at that time.

## 2015-09-15 ENCOUNTER — Ambulatory Visit: Payer: Managed Care, Other (non HMO) | Admitting: Dietician

## 2015-09-17 ENCOUNTER — Encounter: Payer: Self-pay | Admitting: Family Medicine

## 2015-09-30 ENCOUNTER — Ambulatory Visit: Payer: Managed Care, Other (non HMO) | Admitting: Dietician

## 2015-10-20 ENCOUNTER — Ambulatory Visit (INDEPENDENT_AMBULATORY_CARE_PROVIDER_SITE_OTHER): Payer: BLUE CROSS/BLUE SHIELD | Admitting: Licensed Clinical Social Worker

## 2015-10-29 ENCOUNTER — Ambulatory Visit: Payer: BLUE CROSS/BLUE SHIELD | Admitting: Licensed Clinical Social Worker

## 2015-11-04 ENCOUNTER — Ambulatory Visit (INDEPENDENT_AMBULATORY_CARE_PROVIDER_SITE_OTHER): Payer: PRIVATE HEALTH INSURANCE | Admitting: Licensed Clinical Social Worker

## 2015-11-04 DIAGNOSIS — F330 Major depressive disorder, recurrent, mild: Secondary | ICD-10-CM

## 2015-11-04 DIAGNOSIS — F411 Generalized anxiety disorder: Secondary | ICD-10-CM | POA: Diagnosis not present

## 2015-11-18 ENCOUNTER — Ambulatory Visit (INDEPENDENT_AMBULATORY_CARE_PROVIDER_SITE_OTHER): Payer: PRIVATE HEALTH INSURANCE | Admitting: Licensed Clinical Social Worker

## 2015-11-18 DIAGNOSIS — F3181 Bipolar II disorder: Secondary | ICD-10-CM

## 2016-06-17 DIAGNOSIS — T819XXA Unspecified complication of procedure, initial encounter: Secondary | ICD-10-CM | POA: Insufficient documentation

## 2017-07-28 DIAGNOSIS — G40909 Epilepsy, unspecified, not intractable, without status epilepticus: Secondary | ICD-10-CM | POA: Insufficient documentation

## 2017-07-28 DIAGNOSIS — G4733 Obstructive sleep apnea (adult) (pediatric): Secondary | ICD-10-CM | POA: Insufficient documentation

## 2017-07-28 DIAGNOSIS — G43009 Migraine without aura, not intractable, without status migrainosus: Secondary | ICD-10-CM | POA: Insufficient documentation

## 2018-12-21 ENCOUNTER — Other Ambulatory Visit: Payer: Self-pay

## 2018-12-21 ENCOUNTER — Encounter: Payer: Self-pay | Admitting: Podiatry

## 2018-12-21 ENCOUNTER — Ambulatory Visit (INDEPENDENT_AMBULATORY_CARE_PROVIDER_SITE_OTHER): Payer: BC Managed Care – PPO | Admitting: Podiatry

## 2018-12-21 ENCOUNTER — Other Ambulatory Visit: Payer: Self-pay | Admitting: Podiatry

## 2018-12-21 ENCOUNTER — Ambulatory Visit (INDEPENDENT_AMBULATORY_CARE_PROVIDER_SITE_OTHER): Payer: BC Managed Care – PPO

## 2018-12-21 VITALS — BP 120/56 | HR 58 | Temp 97.5°F | Resp 16

## 2018-12-21 DIAGNOSIS — S86012A Strain of left Achilles tendon, initial encounter: Secondary | ICD-10-CM | POA: Diagnosis not present

## 2018-12-21 DIAGNOSIS — M722 Plantar fascial fibromatosis: Secondary | ICD-10-CM

## 2018-12-21 MED ORDER — METHYLPREDNISOLONE 4 MG PO TBPK: ORAL_TABLET | ORAL | 0 refills | Status: DC

## 2018-12-21 MED ORDER — DICLOFENAC SODIUM 1 % TD GEL: 4.0000 g | Freq: Four times a day (QID) | TRANSDERMAL | 2 refills | Status: DC

## 2018-12-21 NOTE — Progress Notes (Signed)
Subjective:  Patient ID: [name], female    DOB: 04/22/1961,  MRN: [id number] HPI Chief Complaint  Patient presents with  . Foot Pain    Achilles area left - aching x 6 months, husband in hospital so has been walking a lot more, takes naproxen PRN or Tylenol  . New Patient (Initial Visit)    Est pt 06/2015    58 y.o. female presents with the above complaint.   ROS: Denies fever chills nausea vomiting muscle aches pains calf pain back pain chest pain shortness of breath.  No past medical history on file. No past surgical history on file.  Current Outpatient Medications:  .  Cholecalciferol (VITAMIN D3) 125 MCG (5000 UT) TABS, Take by mouth., Disp: , Rfl:  .  acyclovir (ZOVIRAX) 400 MG tablet, Take 400 mg by mouth., Disp: , Rfl:  .  cholestyramine (QUESTRAN) 4 g packet, Take by mouth., Disp: , Rfl:  .  diclofenac sodium (VOLTAREN) 1 % GEL, Apply 4 g topically 4 (four) times daily., Disp: 100 g, Rfl: 2 .  hydrochlorothiazide (HYDRODIURIL) 25 MG tablet, , Disp: , Rfl:  .  methylPREDNISolone (MEDROL DOSEPAK) 4 MG TBPK tablet, 6 day dose pack - take as directed, Disp: 21 tablet, Rfl: 0 .  primidone (MYSOLINE) 250 MG tablet, , Disp: , Rfl:  .  rivaroxaban (XARELTO) 20 MG TABS tablet, Take 20 mg by mouth daily with supper., Disp: , Rfl:  .  sertraline (ZOLOFT) 100 MG tablet, , Disp: , Rfl:  .  SYNTHROID 137 MCG tablet, , Disp: , Rfl:  .  zaleplon (SONATA) 5 MG capsule, TAKE ONE CAPSULE (5 MG DOSE) BY MOUTH AT BEDTIME FOR 30 DAYS., Disp: , Rfl:  .  ZOLMitriptan (ZOMIG) 2.5 MG tablet, TAKE 1 TABLET (2.5 MG TOTAL) BY MOUTH AS NEEDED FOR MIGRAINE., Disp: , Rfl:   Allergies  Allergen Reactions  . Amoxicillin-Pot Clavulanate Other (See Comments)    Other reaction(s): Unknown unknown Other reaction(s): Other (See Comments) unknown Other reaction(s): Other (See Comments) unknown   . Phenazopyridine     Other reaction(s): Other unknown Other reaction(s): Other (See Comments)  unknown unknown   . Carbamazepine   . Codeine   . Erythromycin   . Nitrofurantoin   . Other     Other reaction(s): Other (See Comments) Other reaction(s): Other (See Comments)   . Phenytoin   . Sulfa Antibiotics   . Sulfasalazine   . Bupropion Diarrhea    Other reaction(s): Other (See Comments) unknown Other reaction(s): Other (See Comments) unknown unknown   . Cephalexin Diarrhea    Other reaction(s): Other (See Comments) unknown Other reaction(s): Other (See Comments) unknown Other reaction(s): Other (See Comments) unknown   . Metronidazole Diarrhea    Other reaction(s): Other (See Comments) unknown Other reaction(s): Other (See Comments) unknown Other reaction(s): Other (See Comments) unknown    Review of Systems Objective:   Vitals:   12/21/18 0850  BP: (!) 120/56  Pulse: (!) 58  Resp: 16  Temp: (!) 97.5 F (36.4 C)    General: Well developed, nourished, in no acute distress, alert and oriented x3   Dermatological: Skin is warm, dry and supple bilateral. Nails x 10 are well maintained; remaining integument appears unremarkable at this time. There are no open sores, no preulcerative lesions, no rash or signs of infection present.  Vascular: Dorsalis Pedis artery and Posterior Tibial artery pedal pulses are 2/4 bilateral with immedate capillary fill time. Pedal hair growth present. No varicosities and no  lower extremity edema present bilateral.   Neruologic: Grossly intact via light touch bilateral. Vibratory intact via tuning fork bilateral. Protective threshold with Semmes Wienstein monofilament intact to all pedal sites bilateral. Patellar and Achilles deep tendon reflexes 2+ bilateral. No Babinski or clonus noted bilateral.   Musculoskeletal: No gross boney pedal deformities bilateral. No pain, crepitus, or limitation noted with foot and ankle range of motion bilateral. Muscular strength 5/5 in all groups tested bilateral.  Pain and thickening on  palpation of the Achilles just superior to the insertion site on the calcaneus.  There is no loss of integrity and there is a negative Thompson's test.  Gait: Unassisted, Nonantalgic.    Radiographs:  Radiographs taken today demonstrate an osseously mature foot normal osseous architecture of the foot however soft tissue of the Achilles demonstrates thickening of the watershed area consistent with a probable tear.  I do not see complete tear.  Assessment & Plan:   Assessment: Midsubstance Achilles tear at watershed area.  Incomplete.  Plan: Discussed etiology pathology conservative versus surgical therapies.  At this point I started her on a Medrol Dosepak to be followed by diclofenac gel because of her blood thinner.  I also placed her in a short cam walker and I will follow-up with her in 4 to 6 weeks for reevaluation.  If not improved will consider MRI for possible repair once her husband is clear from cancer.     Max T. [place], [place]

## 2018-12-29 ENCOUNTER — Telehealth: Payer: Self-pay | Admitting: *Deleted

## 2018-12-29 NOTE — Telephone Encounter (Signed)
Pt called states her boot will not inflate.

## 2018-12-29 NOTE — Telephone Encounter (Signed)
Left message informing pt that we would replace the boot, to stop by the office today before 5:00pm and someone would help her.

## 2019-02-01 ENCOUNTER — Telehealth: Payer: Self-pay | Admitting: *Deleted

## 2019-02-01 ENCOUNTER — Other Ambulatory Visit: Payer: Self-pay

## 2019-02-01 ENCOUNTER — Ambulatory Visit (INDEPENDENT_AMBULATORY_CARE_PROVIDER_SITE_OTHER): Payer: BC Managed Care – PPO | Admitting: Podiatry

## 2019-02-01 ENCOUNTER — Encounter: Payer: Self-pay | Admitting: Podiatry

## 2019-02-01 DIAGNOSIS — M25372 Other instability, left ankle: Secondary | ICD-10-CM

## 2019-02-01 DIAGNOSIS — S86012D Strain of left Achilles tendon, subsequent encounter: Secondary | ICD-10-CM

## 2019-02-01 NOTE — Telephone Encounter (Signed)
Pt states she was seen in office today and was give a soft TriLok brace, do I wear it at night and am I suppose to continue with the night boot.

## 2019-02-01 NOTE — Progress Notes (Signed)
She presents today for follow-up of her Achilles tendinitis left foot states that her ankle just feels weak.  States she continues to wear her cam walker on a regular basis.  She says the tendon itself feels much better than it did.  Objective: Vital signs are stable she is alert and oriented x3.  Pulses are palpable.  She no longer has pain to palpation of the Achilles tendon or its insertion site.  She does however have some tenderness on inversion and eversion particular along the medial and lateral tendons.  More than likely these tendons were being utilized as a compensatory strategy to help alleviate the Achilles tendinitis.  Assessment: Instability of the ankle joint left.  Most likely weakness from the use of the cam walker or failure of the Achilles to work well.  Plan: Discussed etiology pathology conservative versus surgical therapies.  At this point placed her in a Tri-Lock brace I will follow-up with her in 1 month if she is not improved we will consider physical therapy at that time.

## 2019-02-02 ENCOUNTER — Telehealth: Payer: Self-pay | Admitting: Podiatry

## 2019-02-02 NOTE — Telephone Encounter (Signed)
Pt is calling back about TriLok brace,she wanted know does she wear it at night and am I suppose to continue with the night boot. She would like a call back before the weekend

## 2019-02-02 NOTE — Telephone Encounter (Signed)
I called patient - informed her to wear the night splint at night and the trilok brace during the day. She said the diclofenac gel helped her big toe pain on the opposite foot, she wanted to know if she could put it on her achilles, I told her she could try it

## 2019-02-20 ENCOUNTER — Other Ambulatory Visit: Payer: Self-pay

## 2019-02-20 ENCOUNTER — Encounter: Payer: Self-pay | Admitting: Podiatry

## 2019-02-20 ENCOUNTER — Ambulatory Visit (INDEPENDENT_AMBULATORY_CARE_PROVIDER_SITE_OTHER): Payer: BC Managed Care – PPO | Admitting: Podiatry

## 2019-02-20 DIAGNOSIS — S86012D Strain of left Achilles tendon, subsequent encounter: Secondary | ICD-10-CM | POA: Diagnosis not present

## 2019-02-20 DIAGNOSIS — M25372 Other instability, left ankle: Secondary | ICD-10-CM

## 2019-02-20 NOTE — Progress Notes (Signed)
She presents today for follow-up of her Achilles tendinitis states that I am having lots of pain on the back of my heel now the bottom of the heel on each side of the heel medially and laterally is throbbing yesterday Subedi could not stand it.  The brace he gave me puts too much pressure on the heel number still wearing the splint every night but are not wearing the cam boot.  Objective: Vital signs are stable alert and oriented x3.  Pulses are palpable.  She has pain on palpation of the Achilles left.  She also has pain on palpation of the plantar fascia left.  She also has pain on palpation medial and lateral compression of the calcaneus superficially.  Assessment: Probable soft tissue tear of the Achilles as well as the plantar fascia.  Plan: At this point we are requesting an MRI of the rear foot I will follow-up with her once that comes in.

## 2019-02-21 ENCOUNTER — Telehealth: Payer: Self-pay | Admitting: *Deleted

## 2019-02-21 DIAGNOSIS — S86012D Strain of left Achilles tendon, subsequent encounter: Secondary | ICD-10-CM

## 2019-02-21 DIAGNOSIS — M25372 Other instability, left ankle: Secondary | ICD-10-CM

## 2019-02-21 DIAGNOSIS — S86012A Strain of left Achilles tendon, initial encounter: Secondary | ICD-10-CM

## 2019-02-21 NOTE — Telephone Encounter (Signed)
-----   Message from Rip Harbour, [facility] sent at 02/20/2019 11:20 AM EDT ----- Regarding: MRI MRI ankle left - evaluate achilles tendon tear and plantar fascial tear left - surgical consideration

## 2019-02-21 NOTE — Telephone Encounter (Signed)
Orders to L. Cox, CMA for pre-cert, faxed to [facility]. 

## 2019-02-22 ENCOUNTER — Telehealth: Payer: Self-pay | Admitting: *Deleted

## 2019-02-22 NOTE — Telephone Encounter (Signed)
Called and spoke with Lannette Donath at [facility] ([facility] and the representative stated that the procedure code [id number] requires prior authorization and the authorization number is [id number] and the case number (reference) is [id number] and is valid 02/22/19 thru 08/21/19 and the [facility] telephone number is [phone]. Lattie Haw

## 2019-03-05 ENCOUNTER — Other Ambulatory Visit: Payer: Self-pay | Admitting: Podiatry

## 2019-03-10 ENCOUNTER — Ambulatory Visit
Admission: RE | Admit: 2019-03-10 | Discharge: 2019-03-10 | Disposition: A | Discharge status: Home / Self Care | Payer: BC Managed Care – PPO | Source: Ambulatory Visit | Attending: Podiatry | Admitting: Podiatry

## 2019-03-10 ENCOUNTER — Other Ambulatory Visit: Payer: Self-pay

## 2019-03-14 ENCOUNTER — Telehealth: Payer: Self-pay | Admitting: Podiatry

## 2019-03-14 NOTE — Telephone Encounter (Signed)
Left voicemail for patient to call and schedule appt to discuss MRI Results

## 2019-03-15 ENCOUNTER — Ambulatory Visit: Payer: BC Managed Care – PPO | Admitting: Podiatry

## 2019-03-15 ENCOUNTER — Ambulatory Visit (INDEPENDENT_AMBULATORY_CARE_PROVIDER_SITE_OTHER): Payer: BC Managed Care – PPO | Admitting: Podiatry

## 2019-03-15 ENCOUNTER — Telehealth: Payer: Self-pay | Admitting: *Deleted

## 2019-03-15 ENCOUNTER — Other Ambulatory Visit: Payer: Self-pay

## 2019-03-15 DIAGNOSIS — M7662 Achilles tendinitis, left leg: Secondary | ICD-10-CM

## 2019-03-15 DIAGNOSIS — M722 Plantar fascial fibromatosis: Secondary | ICD-10-CM

## 2019-03-15 DIAGNOSIS — S86012D Strain of left Achilles tendon, subsequent encounter: Secondary | ICD-10-CM

## 2019-03-15 NOTE — Telephone Encounter (Signed)
-----   Message from Rip Harbour, [facility] sent at 03/15/2019  3:09 PM EST ----- Regarding: PT [facility] -   Evaluate and treat achilles tendonitis and plantar fasciitis left - focus on mobility, strengthening and pain  Duration: 3 x week x 4 weeks

## 2019-03-15 NOTE — Telephone Encounter (Signed)
Delivered referral to [facility] PT.

## 2019-03-17 NOTE — Progress Notes (Signed)
She presents today for follow-up of her MRI.  States that she is still has pain to the left heel states that it really feels about the same as it has she wears the plantar cam walker when she can but she does wear it all the time.  Objective: Vital signs are stable she is alert and oriented x3 she has mild to moderate tenderness on palpation of the left Achilles tendon and of the plantar fascia.  Chronic intractable plantar fasciitis.  Achilles tendinopathy with small interstitial tears.  Assessment: Plantar fasciitis Achilles tendinitis.  Plan: This point requesting physical therapy if not improved in a month we will consider surgery.

## 2019-04-24 ENCOUNTER — Other Ambulatory Visit: Payer: Self-pay

## 2019-04-24 ENCOUNTER — Encounter: Payer: Self-pay | Admitting: Podiatry

## 2019-04-24 ENCOUNTER — Ambulatory Visit (INDEPENDENT_AMBULATORY_CARE_PROVIDER_SITE_OTHER): Payer: BC Managed Care – PPO | Admitting: Podiatry

## 2019-04-24 DIAGNOSIS — M7662 Achilles tendinitis, left leg: Secondary | ICD-10-CM

## 2019-04-24 DIAGNOSIS — M722 Plantar fascial fibromatosis: Secondary | ICD-10-CM | POA: Diagnosis not present

## 2019-04-25 NOTE — Progress Notes (Signed)
She presents today and states that she is about 80 to 85% improved as far as her Achilles tendinitis and fasciitis of her left foot.  States that I feel that the PT has helped considerably.  ROS: Denies fever chills nausea vomiting muscle aches pains calf pain back pain chest pain shortness of breath.  Objective: Vital signs stable alert and oriented x3 I reviewed her past medical history medications allergies surgeries and social history.  Pulses are palpable.  Neurologic sensorium is intact and reflexes are intact muscle strength is normal symmetrical.  She has no open lesions or wounds.  Mild tenderness on palpation of the Achilles and which is not warm to the touch and of the plantar fascia again not warm to the touch much improved from previous evaluation.  Assessment: Slowly resolving Achilles tendinitis plantar fasciitis left foot.  Plan: I would highly recommend that she continue with physical therapy at this point I will follow-up with her once they are complete.

## 2020-04-16 ENCOUNTER — Other Ambulatory Visit: Payer: Self-pay | Admitting: Gastroenterology

## 2020-06-02 ENCOUNTER — Other Ambulatory Visit (HOSPITAL_COMMUNITY)
Admission: RE | Admit: 2020-06-02 | Discharge: 2020-06-02 | Disposition: A | Discharge status: Home / Self Care | Payer: BC Managed Care – PPO | Source: Ambulatory Visit | Attending: Gastroenterology | Admitting: Gastroenterology

## 2020-06-02 DIAGNOSIS — Z20822 Contact with and (suspected) exposure to covid-19: Secondary | ICD-10-CM | POA: Diagnosis not present

## 2020-06-02 DIAGNOSIS — Z01812 Encounter for preprocedural laboratory examination: Secondary | ICD-10-CM | POA: Insufficient documentation

## 2020-06-02 DIAGNOSIS — K5730 Diverticulosis of large intestine without perforation or abscess without bleeding: Secondary | ICD-10-CM | POA: Diagnosis not present

## 2020-06-02 DIAGNOSIS — K644 Residual hemorrhoidal skin tags: Secondary | ICD-10-CM | POA: Diagnosis not present

## 2020-06-02 DIAGNOSIS — K317 Polyp of stomach and duodenum: Secondary | ICD-10-CM | POA: Diagnosis not present

## 2020-06-02 DIAGNOSIS — K222 Esophageal obstruction: Secondary | ICD-10-CM | POA: Diagnosis not present

## 2020-06-02 DIAGNOSIS — Z1211 Encounter for screening for malignant neoplasm of colon: Secondary | ICD-10-CM | POA: Diagnosis not present

## 2020-06-02 DIAGNOSIS — K648 Other hemorrhoids: Secondary | ICD-10-CM | POA: Diagnosis not present

## 2020-06-02 DIAGNOSIS — K449 Diaphragmatic hernia without obstruction or gangrene: Secondary | ICD-10-CM | POA: Diagnosis not present

## 2020-06-02 LAB — SARS CORONAVIRUS 2 (TAT 6-24 HRS): SARS Coronavirus 2: NEGATIVE

## 2020-06-02 NOTE — Anesthesia Preprocedure Evaluation (Signed)
Anesthesia Evaluation  Patient identified by MRN, date of birth, ID band Patient awake    Reviewed: Allergy & Precautions, NPO status , Patient's Chart, lab work & pertinent test results  Airway Mallampati: II  TM Distance: >3 FB Neck ROM: Full    Dental no notable dental hx. (+) Teeth Intact, Dental Advisory Given   Pulmonary sleep apnea ,    Pulmonary exam normal breath sounds clear to auscultation       Cardiovascular Exercise Tolerance: Good hypertension, Pt. on medications Normal cardiovascular exam Rhythm:Regular Rate:Normal     Neuro/Psych  Headaches, Seizures -,  Depression    GI/Hepatic negative GI ROS, Neg liver ROS,   Endo/Other  Hypothyroidism   Renal/GU negative Renal ROS     Musculoskeletal negative musculoskeletal ROS (+)   Abdominal (+) + obese,   Peds  Hematology   Anesthesia Other Findings   Reproductive/Obstetrics                            Anesthesia Physical Anesthesia Plan  ASA: III  Anesthesia Plan: MAC   Post-op Pain Management:    Induction: Intravenous  PONV Risk Score and Plan: Treatment may vary due to age or medical condition and Ondansetron  Airway Management Planned: Nasal Cannula and Natural Airway  Additional Equipment: None  Intra-op Plan:   Post-operative Plan:   Informed Consent: I have reviewed the patients History and Physical, chart, labs and discussed the procedure including the risks, benefits and alternatives for the proposed anesthesia with the patient or authorized representative who has indicated his/her understanding and acceptance.     Dental advisory given  Plan Discussed with: CRNA  Anesthesia Plan Comments:        Anesthesia Quick Evaluation

## 2020-06-03 ENCOUNTER — Encounter (HOSPITAL_COMMUNITY)
Admission: RE | Disposition: A | Discharge status: Home / Self Care | Payer: Self-pay | Source: Home / Self Care | Attending: Gastroenterology

## 2020-06-03 ENCOUNTER — Ambulatory Visit (HOSPITAL_COMMUNITY): Payer: BC Managed Care – PPO | Admitting: Anesthesiology

## 2020-06-03 ENCOUNTER — Ambulatory Visit (HOSPITAL_COMMUNITY)
Admission: RE | Admit: 2020-06-03 | Discharge: 2020-06-03 | Disposition: A | Discharge status: Home / Self Care | Payer: BC Managed Care – PPO | Attending: Gastroenterology | Admitting: Gastroenterology

## 2020-06-03 ENCOUNTER — Encounter (HOSPITAL_COMMUNITY): Payer: Self-pay | Admitting: Gastroenterology

## 2020-06-03 ENCOUNTER — Other Ambulatory Visit: Payer: Self-pay

## 2020-06-03 DIAGNOSIS — K317 Polyp of stomach and duodenum: Secondary | ICD-10-CM | POA: Insufficient documentation

## 2020-06-03 DIAGNOSIS — K5730 Diverticulosis of large intestine without perforation or abscess without bleeding: Secondary | ICD-10-CM | POA: Insufficient documentation

## 2020-06-03 DIAGNOSIS — K644 Residual hemorrhoidal skin tags: Secondary | ICD-10-CM | POA: Insufficient documentation

## 2020-06-03 DIAGNOSIS — Z20822 Contact with and (suspected) exposure to covid-19: Secondary | ICD-10-CM | POA: Insufficient documentation

## 2020-06-03 DIAGNOSIS — K648 Other hemorrhoids: Secondary | ICD-10-CM | POA: Insufficient documentation

## 2020-06-03 DIAGNOSIS — Z1211 Encounter for screening for malignant neoplasm of colon: Secondary | ICD-10-CM | POA: Insufficient documentation

## 2020-06-03 DIAGNOSIS — K449 Diaphragmatic hernia without obstruction or gangrene: Secondary | ICD-10-CM | POA: Insufficient documentation

## 2020-06-03 DIAGNOSIS — K222 Esophageal obstruction: Secondary | ICD-10-CM | POA: Insufficient documentation

## 2020-06-03 HISTORY — PX: BIOPSY: SHX5522

## 2020-06-03 HISTORY — PX: BALLOON DILATION: SHX5330

## 2020-06-03 HISTORY — PX: ESOPHAGOGASTRODUODENOSCOPY (EGD) WITH PROPOFOL: SHX5813

## 2020-06-03 HISTORY — PX: POLYPECTOMY: SHX5525

## 2020-06-03 HISTORY — PX: COLONOSCOPY WITH PROPOFOL: SHX5780

## 2020-06-03 SURGERY — ESOPHAGOGASTRODUODENOSCOPY (EGD) WITH PROPOFOL
Anesthesia: Monitor Anesthesia Care

## 2020-06-03 MED ORDER — DEXMEDETOMIDINE (PRECEDEX) IN NS 20 MCG/5ML (4 MCG/ML) IV SYRINGE
PREFILLED_SYRINGE | INTRAVENOUS | Status: DC | PRN
  Administered 2020-06-03 (×2): 4 ug via INTRAVENOUS
  Administered 2020-06-03: 8 ug via INTRAVENOUS
  Administered 2020-06-03: 4 ug via INTRAVENOUS

## 2020-06-03 MED ORDER — LIDOCAINE VISCOUS HCL 2 % MT SOLN
OROMUCOSAL | Status: AC
  Filled 2020-06-03: qty 15

## 2020-06-03 MED ORDER — PHENYLEPHRINE 40 MCG/ML (10ML) SYRINGE FOR IV PUSH (FOR BLOOD PRESSURE SUPPORT)
PREFILLED_SYRINGE | INTRAVENOUS | Status: DC | PRN
  Administered 2020-06-03: 80 ug via INTRAVENOUS
  Administered 2020-06-03 (×4): 40 ug via INTRAVENOUS

## 2020-06-03 MED ORDER — PROPOFOL 1000 MG/100ML IV EMUL
INTRAVENOUS | Status: AC
  Filled 2020-06-03: qty 100

## 2020-06-03 MED ORDER — DEXMEDETOMIDINE (PRECEDEX) IN NS 20 MCG/5ML (4 MCG/ML) IV SYRINGE
PREFILLED_SYRINGE | INTRAVENOUS | Status: AC
  Filled 2020-06-03: qty 5

## 2020-06-03 MED ORDER — SODIUM CHLORIDE 0.9 % IV SOLN: INTRAVENOUS | Status: DC

## 2020-06-03 MED ORDER — LIDOCAINE HCL URETHRAL/MUCOSAL 2 % EX GEL
CUTANEOUS | Status: DC | PRN
  Administered 2020-06-03: 1 via TOPICAL

## 2020-06-03 MED ORDER — PROPOFOL 500 MG/50ML IV EMUL
INTRAVENOUS | Status: DC | PRN
  Administered 2020-06-03: 125 ug/kg/min via INTRAVENOUS

## 2020-06-03 MED ORDER — GLYCOPYRROLATE 0.2 MG/ML IJ SOLN
INTRAMUSCULAR | Status: DC | PRN
  Administered 2020-06-03 (×2): .1 mg via INTRAVENOUS

## 2020-06-03 MED ORDER — PROPOFOL 10 MG/ML IV BOLUS
INTRAVENOUS | Status: DC | PRN
  Administered 2020-06-03: 30 mg via INTRAVENOUS

## 2020-06-03 MED ORDER — LACTATED RINGERS IV SOLN
INTRAVENOUS | Status: DC
  Administered 2020-06-03: 1000 mL via INTRAVENOUS

## 2020-06-03 SURGICAL SUPPLY — 24 items

## 2020-06-03 NOTE — Transfer of Care (Signed)
Immediate Anesthesia Transfer of Care Note  Patient: [name]  Procedure(s) Performed: ESOPHAGOGASTRODUODENOSCOPY (EGD) WITH PROPOFOL (N/A ) SAVORY DILATION (N/A ) COLONOSCOPY WITH PROPOFOL (N/A ) POLYPECTOMY BIOPSY  Patient Location: PACU  Anesthesia Type:MAC  Level of Consciousness: awake, alert  and oriented  Airway & Oxygen Therapy: Patient Spontanous Breathing and Patient connected to face mask oxygen  Post-op Assessment: Report given to RN and Post -op Vital signs reviewed and stable  Post vital signs: Reviewed and stable  Last Vitals:  Vitals Value Taken Time  BP 117/46 06/03/20 0927  Temp    Pulse 67 06/03/20 0930  Resp 28 06/03/20 0930  SpO2 98 % 06/03/20 0930  Vitals shown include unvalidated device data.  Last Pain:  Vitals:   06/03/20 0927  TempSrc:   PainSc: 0-No pain         Complications: No complications documented.

## 2020-06-03 NOTE — Op Note (Signed)
[facility] Patient Name: [name] Procedure Date: 06/03/2020 MRN: [id number] Attending MD: Kathi Der , MD Date of Birth: [birth year] CSN: [id number] Age: 60 Admit Type: Outpatient Procedure:                Upper GI endoscopy Indications:              For therapy of gastric polyps, For therapy of                            esophageal stenosis Providers:                Kathi Der, MD, Fayrene Fearing, RN, Kandice Robinsons, Technician Referring MD:              Medicines:                Sedation Administered by an Anesthesia Professional Complications:            No immediate complications. Estimated Blood Loss:     Estimated blood loss was minimal. Procedure:                Pre-Anesthesia Assessment:                           - Prior to the procedure, a History and Physical                            was performed, and patient medications and                            allergies were reviewed. The patient's tolerance of                            previous anesthesia was also reviewed. The risks                            and benefits of the procedure and the sedation                            options and risks were discussed with the patient.                            All questions were answered, and informed consent                            was obtained. Prior Anticoagulants: The patient has                            taken no previous anticoagulant or antiplatelet                            agents. ASA Grade Assessment: III - A patient with  severe systemic disease. After reviewing the risks                            and benefits, the patient was deemed in                            satisfactory condition to undergo the procedure.                           After obtaining informed consent, the endoscope was                            passed under direct vision. Throughout the                             procedure, the patient's blood pressure, pulse, and                            oxygen saturations were monitored continuously. The                            GIF-H190 ([id number] Olympus gastroscope was                            introduced through the mouth, and advanced to the                            second part of duodenum. The upper GI endoscopy was                            performed with moderate difficulty due to the                            patient's body habitus. The patient tolerated the                            procedure well. Scope In: Scope Out: Findings:      A non-obstructing Schatzki ring was found at the gastroesophageal       junction at 34 cm. A TTS dilator was passed through the scope. Dilation       with a 15-16.5-18 mm balloon dilator was performed to 18 mm. The       dilation site was examined following endoscope reinsertion and showed no       bleeding, mucosal tear or perforation.      A 6 cm hiatal hernia was present.      Multiple 6 to 9 mm sessile polyps with no stigmata of recent bleeding       were found in the gastric fundus and in the gastric body. Few large       polyps were removed with a cold snare. Resection and retrieval with       rescue net were complete.      The cardia and gastric fundus were normal on retroflexion.      The duodenal bulb, first portion of the duodenum and second portion of  the duodenum were normal. Impression:               - Non-obstructing Schatzki ring. Dilated.                           - 6 cm hiatal hernia.                           - Multiple gastric polyps. Resected and retrieved.                           - Normal duodenal bulb, first portion of the                            duodenum and second portion of the duodenum. Moderate Sedation:      Moderate (conscious) sedation was personally administered by an       anesthesia professional. The following parameters were monitored: oxygen       saturation,  heart rate, blood pressure, and response to care. Recommendation:           - Patient has a contact number available for                            emergencies. The signs and symptoms of potential                            delayed complications were discussed with the                            patient. Return to normal activities tomorrow.                            Written discharge instructions were provided to the                            patient.                           - Resume previous diet.                           - Continue present medications.                           - Await pathology results.                           - Perform a colonoscopy today. Procedure Code(s):        --- Professional ---                           [phone], Esophagogastroduodenoscopy, flexible,                            transoral; with removal of tumor(s), polyp(s), or  other lesion(s) by snare technique                           [id number], Esophagogastroduodenoscopy, flexible,                            transoral; with transendoscopic balloon dilation of                            esophagus (less than 30 mm diameter) Diagnosis Code(s):        --- Professional ---                           K22.2, Esophageal obstruction                           K44.9, Diaphragmatic hernia without obstruction or                            gangrene                           K31.7, Polyp of stomach and duodenum CPT copyright 2019 American Medical Association. All rights reserved. The codes documented in this report are preliminary and upon coder review may  be revised to meet current compliance requirements. Kathi Der, MD Kathi Der, MD 06/03/2020 9:30:14 AM Number of Addenda: 0

## 2020-06-03 NOTE — Anesthesia Postprocedure Evaluation (Signed)
Anesthesia Post Note  Patient: [name]  Procedure(s) Performed: ESOPHAGOGASTRODUODENOSCOPY (EGD) WITH PROPOFOL (N/A ) COLONOSCOPY WITH PROPOFOL (N/A ) POLYPECTOMY BIOPSY BALLOON DILATION (N/A )     Patient location during evaluation: Endoscopy Anesthesia Type: MAC Level of consciousness: awake and alert Pain management: pain level controlled Vital Signs Assessment: post-procedure vital signs reviewed and stable Respiratory status: spontaneous breathing, nonlabored ventilation, respiratory function stable and patient connected to nasal cannula oxygen Cardiovascular status: blood pressure returned to baseline and stable Postop Assessment: no apparent nausea or vomiting Anesthetic complications: no   No complications documented.  Last Vitals:  Vitals:   06/03/20 0930 06/03/20 0940  BP: (!) 121/56 120/60  Pulse: 67 63  Resp: (!) 28 19  Temp:    SpO2: 98% 96%    Last Pain:  Vitals:   06/03/20 0940  TempSrc:   PainSc: 0-No pain                 Barnet Glasgow

## 2020-06-03 NOTE — Discharge Instructions (Signed)

## 2020-06-03 NOTE — H&P (Signed)
Primary Care Physician:  Barbie Banner, MD Primary Gastroenterologist:  Dr. Levora Angel  Reason for Visit : Esophageal dysphagia, colon cancer screening  HPI: [name] is a 60 y.o. female here for outpatient screening colonoscopy as well as EGD with dilation for lower esophageal ring.  Patient with history of dysphagia.  Last EGD with dilation was done in 2019 when through-the-scope dilation with 18 mm balloon was performed.  Patient had few gastric polyps which were not removed because of patient's cough at the time of procedure.  Patient denies any acute GI symptoms.  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  Last colonoscopy in October 2011 was normal.  History reviewed. No pertinent past medical history.  History reviewed. No pertinent surgical history.  Prior to Admission medications   Medication Sig Start Date End Date Taking? Authorizing Provider  acetaminophen (TYLENOL) 500 MG tablet Take 1,000 mg by mouth every 6 (six) hours as needed for moderate pain or headache.   Yes [provider]  acyclovir (ZOVIRAX) 400 MG tablet Take 400 mg by mouth daily.   Yes [provider]  Cholecalciferol (VITAMIN D3) 125 MCG (5000 UT) TABS Take 5,000 Units by mouth daily. 08/22/17  Yes [provider]  cholestyramine Lanetta Inch) 4 GM/DOSE powder Take 4 g by mouth daily. 01/30/19  Yes [provider]  diclofenac sodium (VOLTAREN) 1 % GEL Apply 4 g topically 4 (four) times daily. Patient taking differently: Apply 4 g topically daily as needed (pain). 12/21/18  Yes Hyatt, Max T, DPM  diphenoxylate-atropine (LOMOTIL) 2.5-0.025 MG tablet Take 1 tablet by mouth daily as needed for diarrhea or loose stools. 04/27/20  Yes [provider]  fluticasone (FLONASE) 50 MCG/ACT nasal spray Place 1 spray into both nostrils daily as needed for allergies. 12/26/19  Yes [provider]  hydrochlorothiazide (HYDRODIURIL) 25 MG tablet  Take 25 mg by mouth daily. 06/09/15  Yes [provider]  hydrocortisone 2.5 % cream Apply 1 application topically daily as needed for itching. 04/24/20  Yes [provider]  ketoconazole (NIZORAL) 2 % cream Apply 1 application topically daily as needed for dry skin. 04/24/20  Yes [provider]  Lactobacillus (ACIDOPHILUS) CAPS capsule Take 1 capsule by mouth daily. Probiotic blend   Yes [provider]  levothyroxine (SYNTHROID) 200 MCG tablet Take 200 mcg by mouth daily. 02/07/17  Yes [provider]  Melatonin 10 MG CAPS Take 10 mg by mouth daily as needed (Sleep).   Yes [provider]  multivitamin-lutein (OCUVITE-LUTEIN) CAPS capsule Take 1 capsule by mouth daily.   Yes [provider]  pantoprazole (PROTONIX) 40 MG tablet Take 20 mg by mouth daily. 05/10/20  Yes [provider]  primidone (MYSOLINE) 250 MG tablet Take 250 mg by mouth 3 (three) times daily. 05/30/15  Yes [provider]  rivaroxaban (XARELTO) 20 MG TABS tablet Take 20 mg by mouth every evening.   Yes [provider]  sertraline (ZOLOFT) 100 MG tablet Take 150 tablets by mouth daily. 08/15/19  Yes [provider]  sulfamethoxazole-trimethoprim (BACTRIM) 400-80 MG tablet Take 1 tablet by mouth daily. 04/28/20  Yes [provider]  ZOLMitriptan (ZOMIG) 2.5 MG tablet Take 2.5 mg by mouth daily as needed for migraine. 11/17/18  Yes [provider]  oxybutynin (DITROPAN) 5 MG tablet Take 5 mg by mouth 2 (two) times daily. 05/20/20   [provider]    Scheduled Meds: Continuous Infusions: . sodium chloride    .  lactated ringers 1,000 mL (06/03/20 0715)   PRN Meds:.  Allergies as of 04/16/2020 - Review Complete 04/24/2019  Allergen Reaction Noted  . Amoxicillin-pot clavulanate Other (See Comments) 07/03/2015  . Phenazopyridine  07/03/2015  . Carbamazepine  06/26/2015  . Codeine  06/26/2015  . Erythromycin   06/26/2015  . Nitrofurantoin  06/26/2015  . Other  01/23/2013  . Phenytoin  06/26/2015  . Sulfa antibiotics  06/26/2015  . Sulfasalazine  06/26/2015  . Bupropion Diarrhea 07/03/2015  . Cephalexin Diarrhea 07/03/2015  . Metronidazole Diarrhea 07/03/2015    History reviewed. No pertinent family history.  Social History   Socioeconomic History  . Marital status: Married    Spouse name: Not on file  . Number of children: Not on file  . Years of education: Not on file  . Highest education level: Not on file  Occupational History  . Not on file  Tobacco Use  . Smoking status: Never Smoker  . Smokeless tobacco: Never Used  Substance and Sexual Activity  . Alcohol use: No    Alcohol/week: 0.0 standard drinks  . Drug use: No  . Sexual activity: Not on file  Other Topics Concern  . Not on file  Social History Narrative  . Not on file   Social Determinants of Health   Financial Resource Strain: Not on file  Food Insecurity: Not on file  Transportation Needs: Not on file  Physical Activity: Not on file  Stress: Not on file  Social Connections: Not on file  Intimate Partner Violence: Not on file     Physical Exam: Vital signs: Vitals:   06/03/20 0657  BP: (!) 143/66  Pulse: 66  Resp: 20  Temp: 98.5 F (36.9 C)  SpO2: 99%     General:   Alert, morbidly obese, pleasant and cooperative in NAD Lungs:  Clear throughout to auscultation.   No wheezes, crackles, or rhonchi. No acute distress. Heart:  Regular rate and rhythm; no murmurs, clicks, rubs,  or gallops. Abdomen: Soft, nontender, nondistended, bowel sounds present.  No peritoneal signs Rectal:  Deferred  GI:  Lab Results: No results for input(s): WBC, HGB, HCT, PLT in the last 72 hours. BMET No results for input(s): NA, K, CL, CO2, GLUCOSE, BUN, CREATININE, CALCIUM in the last 72 hours. LFT No results for input(s): PROT, ALBUMIN, AST, ALT, ALKPHOS, BILITOT, BILIDIR, IBILI in the last 72 hours. PT/INR No  results for input(s): LABPROT, INR in the last 72 hours.   Studies/Results: No results found.  Impression/Plan: -Lower esophageal ring -Colon cancer screening -Morbid obesity  Recommendations ----------------------- -Proceed with EGD with dilation and colonoscopy.  Risks (bleeding, infection, bowel perforation that could require surgery, sedation-related changes in cardiopulmonary systems), benefits (identification and possible treatment of source of symptoms, exclusion of certain causes of symptoms), and alternatives (watchful waiting, radiographic imaging studies, empiric medical treatment)  were explained to patient in detail and patient wishes to proceed.    LOS: 0 days   Kathi Der  MD, FACP 06/03/2020, 8:30 AM  Contact #  [phone]

## 2020-06-03 NOTE — Op Note (Signed)
[facility] Patient Name: [name] Procedure Date: 06/03/2020 MRN: [id number] Attending MD: Kathi Der , MD Date of Birth: Oct 04, 1960 CSN: [id number] Age: 60 Admit Type: Outpatient Procedure:                Colonoscopy Indications:              Screening for colorectal malignant neoplasm, Last                            colonoscopy 10 years ago Providers:                Kathi Der, MD, Fayrene Fearing, RN, Kandice Robinsons, Technician Referring MD:              Medicines:                Sedation Administered by an Anesthesia Professional Complications:            No immediate complications. Estimated Blood Loss:     Estimated blood loss: none. Procedure:                Pre-Anesthesia Assessment:                           - Prior to the procedure, a History and Physical                            was performed, and patient medications and                            allergies were reviewed. The patient's tolerance of                            previous anesthesia was also reviewed. The risks                            and benefits of the procedure and the sedation                            options and risks were discussed with the patient.                            All questions were answered, and informed consent                            was obtained. Prior Anticoagulants: The patient has                            taken no previous anticoagulant or antiplatelet                            agents. ASA Grade Assessment: III - A patient with  severe systemic disease. After reviewing the risks                            and benefits, the patient was deemed in                            satisfactory condition to undergo the procedure.                           - Prior to the procedure, a History and Physical                            was performed, and patient medications and                             allergies were reviewed. The patient's tolerance of                            previous anesthesia was also reviewed. The risks                            and benefits of the procedure and the sedation                            options and risks were discussed with the patient.                            All questions were answered, and informed consent                            was obtained. Prior Anticoagulants: The patient has                            taken no previous anticoagulant or antiplatelet                            agents. ASA Grade Assessment: III - A patient with                            severe systemic disease. After reviewing the risks                            and benefits, the patient was deemed in                            satisfactory condition to undergo the procedure.                           After obtaining informed consent, the colonoscope                            was passed under direct vision. Throughout the  procedure, the patient's blood pressure, pulse, and                            oxygen saturations were monitored continuously. The                            [id number]-H190DL ([id number] Olympus pediatric colonscope                            was introduced through the anus and advanced to the                            the terminal ileum, with identification of the                            appendiceal orifice and IC valve. The colonoscopy                            was performed without difficulty. The patient                            tolerated the procedure well. The quality of the                            bowel preparation was adequate to identify polyps 6                            mm and larger in size. Scope In: 9:02:25 AM Scope Out: 9:18:22 AM Scope Withdrawal Time: 0 hours 11 minutes 10 seconds  Total Procedure Duration: 0 hours 15 minutes 57 seconds  Findings:      Skin tags were found on perianal exam.      The  terminal ileum appeared normal.      Normal mucosa was found in the entire colon. Biopsies for histology were       taken with a cold forceps from the ascending colon and descending colon       for evaluation of microscopic colitis.      Multiple small and large-mouthed diverticula were found in the sigmoid       colon and descending colon.      Internal hemorrhoids were found during retroflexion. The hemorrhoids       were small. Impression:               - Perianal skin tags found on perianal exam.                           - The examined portion of the ileum was normal.                           - Normal mucosa in the entire examined colon.                            Biopsied.                           -  Diverticulosis in the sigmoid colon and in the                            descending colon.                           - Internal hemorrhoids. Moderate Sedation:      Moderate (conscious) sedation was personally administered by an       anesthesia professional. The following parameters were monitored: oxygen       saturation, heart rate, blood pressure, and response to care. Recommendation:           - Patient has a contact number available for                            emergencies. The signs and symptoms of potential                            delayed complications were discussed with the                            patient. Return to normal activities tomorrow.                            Written discharge instructions were provided to the                            patient.                           - Resume previous diet.                           - Continue present medications.                           - Await pathology results.                           - Repeat colonoscopy date to be determined after                            pending pathology results are reviewed for                            surveillance based on pathology results.                           - Return to my  office PRN. Procedure Code(s):        --- Professional ---                           [phone], Colonoscopy, flexible; with biopsy, single                            or multiple Diagnosis Code(s):        --- Professional ---  Z12.11, Encounter for screening for malignant                            neoplasm of colon                           K64.8, Other hemorrhoids                           K64.4, Residual hemorrhoidal skin tags                           K57.30, Diverticulosis of large intestine without                            perforation or abscess without bleeding CPT copyright 2019 [employer]. All rights reserved. The codes documented in this report are preliminary and upon coder review may  be revised to meet current compliance requirements. Kathi DerParag Jarae Panas, MD Kathi DerParag Maizy Davanzo, MD 06/03/2020 9:34:05 AM Number of Addenda: 0

## 2020-06-04 ENCOUNTER — Encounter (HOSPITAL_COMMUNITY): Payer: Self-pay | Admitting: Gastroenterology

## 2020-06-04 LAB — SURGICAL PATHOLOGY

## 2020-12-31 ENCOUNTER — Other Ambulatory Visit: Payer: Self-pay | Admitting: Podiatry

## 2021-01-01 NOTE — Telephone Encounter (Signed)
Please advise 

## 2022-04-15 ENCOUNTER — Telehealth: Payer: Self-pay | Admitting: *Deleted

## 2022-04-15 NOTE — Telephone Encounter (Signed)
Called and left voice message that patient may get the gel otc w/o script.

## 2022-04-15 NOTE — Telephone Encounter (Signed)
Yes, that gel is OTC now, so she can just go to the pharmacy and pick it up without a Rx

## 2022-04-15 NOTE — Telephone Encounter (Signed)
Patient is calling to ask for another refill of the Voltaren gel,son took the one previously prescribed home with him. She has not been seen in office since 2020,does she need a f/u appointment, can she get it otc? Please advise.

## 2023-10-24 ENCOUNTER — Encounter: Payer: Self-pay | Admitting: Orthopedic Surgery

## 2023-10-25 ENCOUNTER — Other Ambulatory Visit: Payer: Self-pay | Admitting: Orthopedic Surgery

## 2023-10-25 DIAGNOSIS — M25551 Pain in right hip: Secondary | ICD-10-CM

## 2023-10-25 DIAGNOSIS — M545 Low back pain, unspecified: Secondary | ICD-10-CM

## 2023-10-29 ENCOUNTER — Ambulatory Visit
Admission: RE | Admit: 2023-10-29 | Discharge: 2023-10-29 | Disposition: A | Discharge status: Home / Self Care | Source: Ambulatory Visit | Attending: Orthopedic Surgery | Admitting: Orthopedic Surgery

## 2023-10-29 DIAGNOSIS — M545 Low back pain, unspecified: Secondary | ICD-10-CM

## 2023-10-29 DIAGNOSIS — M25551 Pain in right hip: Secondary | ICD-10-CM

## 2023-10-31 ENCOUNTER — Other Ambulatory Visit
